# Patient Record
Sex: Male | Born: 1995 | Race: Black or African American | Hispanic: No | Marital: Single | Smoking: Never smoker
Health system: Southern US, Community
[De-identification: ages and names within clinical notes are randomized; demographics above are authoritative.]

## PROBLEM LIST (undated history)

## (undated) DIAGNOSIS — B20 Human immunodeficiency virus [HIV] disease: Secondary | ICD-10-CM

---

## 2018-03-11 DIAGNOSIS — R4182 Altered mental status, unspecified: Secondary | ICD-10-CM | POA: Insufficient documentation

## 2018-03-14 DIAGNOSIS — R918 Other nonspecific abnormal finding of lung field: Secondary | ICD-10-CM | POA: Insufficient documentation

## 2018-04-04 DIAGNOSIS — R918 Other nonspecific abnormal finding of lung field: Secondary | ICD-10-CM | POA: Insufficient documentation

## 2018-04-14 DIAGNOSIS — D649 Anemia, unspecified: Secondary | ICD-10-CM | POA: Insufficient documentation

## 2018-04-14 DIAGNOSIS — B20 Human immunodeficiency virus [HIV] disease: Secondary | ICD-10-CM | POA: Insufficient documentation

## 2018-04-14 DIAGNOSIS — D893 Immune reconstitution syndrome: Secondary | ICD-10-CM | POA: Insufficient documentation

## 2020-01-23 ENCOUNTER — Ambulatory Visit: Payer: Self-pay | Attending: Family

## 2020-01-23 DIAGNOSIS — Z23 Encounter for immunization: Secondary | ICD-10-CM

## 2020-01-23 NOTE — Progress Notes (Signed)
   Covid-19 Vaccination Clinic  Name:  Deray Dawes    MRN: [id number] DOB: [birth year]  01/23/2020  Mr. Cappella was observed post Covid-19 immunization for 15 minutes without incident. He was provided with Vaccine Information Sheet and instruction to access the V-Safe system.   Mr. Granja was instructed to call 911 with any severe reactions post vaccine: Marland Kitchen Difficulty breathing  . Swelling of face and throat  . A fast heartbeat  . A bad rash all over body  . Dizziness and weakness   Immunizations Administered    Name Date Dose VIS Date Route   Moderna COVID-19 Vaccine 01/23/2020 10:18 AM 0.5 mL 10/01/2019 Intramuscular   Manufacturer: Gala Murdoch   Lot: [id number]   NDC: 82883-374-45

## 2020-02-05 ENCOUNTER — Ambulatory Visit: Payer: Medicaid Other | Attending: Internal Medicine

## 2020-02-05 DIAGNOSIS — Z20822 Contact with and (suspected) exposure to covid-19: Secondary | ICD-10-CM

## 2020-02-06 LAB — SARS-COV-2, NAA 2 DAY TAT

## 2020-02-06 LAB — NOVEL CORONAVIRUS, NAA: SARS-CoV-2, NAA: NOT DETECTED

## 2020-02-25 ENCOUNTER — Ambulatory Visit: Payer: Medicaid Other | Attending: Family

## 2020-02-25 DIAGNOSIS — Z23 Encounter for immunization: Secondary | ICD-10-CM

## 2020-02-25 NOTE — Progress Notes (Signed)
   Covid-19 Vaccination Clinic  Name:  Paul Serrano    MRN: [id number] DOB: 06/13/1996  02/25/2020  [name] was observed post Covid-19 immunization for 15 minutes without incident. He was provided with Vaccine Information Sheet and instruction to access the V-Safe system.   [name] was instructed to call 911 with any severe reactions post vaccine: Marland Kitchen Difficulty breathing  . Swelling of face and throat  . A fast heartbeat  . A bad rash all over body  . Dizziness and weakness   Immunizations Administered    Name Date Dose VIS Date Route   Moderna COVID-19 Vaccine 02/25/2020 10:36 AM 0.5 mL 10/2019 Intramuscular   Manufacturer: Moderna   Lot: [id number]   NDC: 35329-924-26

## 2020-06-04 ENCOUNTER — Ambulatory Visit: Payer: Medicaid Other | Admitting: Family Medicine

## 2020-06-09 ENCOUNTER — Emergency Department (HOSPITAL_COMMUNITY): Payer: 59

## 2020-06-09 ENCOUNTER — Other Ambulatory Visit: Payer: Self-pay

## 2020-06-09 ENCOUNTER — Emergency Department (HOSPITAL_COMMUNITY)
Admission: EM | Admit: 2020-06-09 | Discharge: 2020-06-09 | Disposition: A | Discharge status: Home / Self Care | Payer: 59 | Attending: Emergency Medicine | Admitting: Emergency Medicine

## 2020-06-09 ENCOUNTER — Encounter (HOSPITAL_COMMUNITY): Payer: Self-pay | Admitting: Emergency Medicine

## 2020-06-09 DIAGNOSIS — B20 Human immunodeficiency virus [HIV] disease: Secondary | ICD-10-CM | POA: Diagnosis not present

## 2020-06-09 DIAGNOSIS — R519 Headache, unspecified: Secondary | ICD-10-CM | POA: Insufficient documentation

## 2020-06-09 HISTORY — DX: Human immunodeficiency virus (HIV) disease: B20

## 2020-06-09 MED ORDER — METOCLOPRAMIDE HCL 5 MG/ML IJ SOLN
10.0000 mg | Freq: Once | INTRAMUSCULAR | Status: AC
  Administered 2020-06-09: 10 mg via INTRAMUSCULAR
  Filled 2020-06-09: qty 2

## 2020-06-09 NOTE — ED Notes (Signed)
Patient received crackers and soda at this time.

## 2020-06-09 NOTE — ED Provider Notes (Signed)
[name] Provider Note   CSN: [id number] Arrival date & time: 06/09/20  7829     History Chief Complaint  Patient presents with  . Headache    Paul Serrano is a 24 y.o. male.  23 year old male who presents with headaches which was bitemporal lasting for several days.  Patient states his symptoms wax and wane and have not been associated fever or chills.  No neck pain.  No photophobia.  Denies any emesis.  Denies any weakness to his arms or legs.  Has been using ibuprofen with temporary relief.  No prior history of same.        Past Medical History:  Diagnosis Date  . HIV (human immunodeficiency virus infection) (HCC)     There are no problems to display for this patient.   History reviewed. No pertinent surgical history.     No family history on file.  Social History   Tobacco Use  . Smoking status: Never Smoker  . Smokeless tobacco: Never Used  Substance Use Topics  . Alcohol use: Never  . Drug use: Never    Home Medications Prior to Admission medications   Medication Sig Start Date End Date Taking? Authorizing Provider  darunavir-cobicistat (PREZCOBIX) 800-150 MG tablet Take 1 tablet by mouth daily with breakfast. Swallow whole. Do NOT crush, break or chew tablets. Take with food.   Yes [provider]  emtricitabine-tenofovir AF (DESCOVY) 200-25 MG tablet Take 1 tablet by mouth daily.   Yes [provider]  Etravirine 200 MG TABS Take 200 mg by mouth in the morning and at bedtime.   Yes [provider]  ibuprofen (ADVIL) 200 MG tablet Take 400 mg by mouth every 6 (six) hours as needed for headache or mild pain.   Yes [provider]    Allergies    Patient has no known allergies.  Review of Systems   Review of Systems  All other systems reviewed and are negative.   Physical Exam Updated Vital Signs BP 110/66 (BP Location: Left Arm)   Pulse (!) 101   Temp 99.4 F (37.4 C)  (Oral)   Resp (!) 27   Ht 1.829 m (6')   Wt 51.6 kg   SpO2 97%   BMI 15.43 kg/m   Physical Exam Vitals and nursing note reviewed.  Constitutional:      General: He is not in acute distress.    Appearance: Normal appearance. He is well-developed. He is not toxic-appearing.  HENT:     Head: Normocephalic and atraumatic.  Eyes:     General: Lids are normal.     Conjunctiva/sclera: Conjunctivae normal.     Pupils: Pupils are equal, round, and reactive to light.  Neck:     Thyroid: No thyroid mass.     Trachea: No tracheal deviation.  Cardiovascular:     Rate and Rhythm: Normal rate and regular rhythm.     Heart sounds: Normal heart sounds. No murmur heard.  No gallop.   Pulmonary:     Effort: Pulmonary effort is normal. No respiratory distress.     Breath sounds: Normal breath sounds. No stridor. No decreased breath sounds, wheezing, rhonchi or rales.  Abdominal:     General: Bowel sounds are normal. There is no distension.     Palpations: Abdomen is soft.     Tenderness: There is no abdominal tenderness. There is no rebound.  Musculoskeletal:        General: No tenderness. Normal range  of motion.     Cervical back: Normal range of motion and neck supple.  Skin:    General: Skin is warm and dry.     Findings: No abrasion or rash.  Neurological:     General: No focal deficit present.     Mental Status: He is alert and oriented to person, place, and time.     GCS: GCS eye subscore is 4. GCS verbal subscore is 5. GCS motor subscore is 6.     Cranial Nerves: No cranial nerve deficit.     Sensory: No sensory deficit.     Motor: Motor function is intact.  Psychiatric:        Speech: Speech normal.        Behavior: Behavior normal.     ED Results / Procedures / Treatments   Labs (all labs ordered are listed, but only abnormal results are displayed) Labs Reviewed - No data to display  EKG EKG Interpretation  Date/Time:  Tuesday June 09 2020 04:25:04 EDT Ventricular  Rate:  141 PR Interval:  130 QRS Duration: 76 QT Interval:  292 QTC Calculation: 447 R Axis:   49 Text Interpretation: Sinus tachycardia Marked ST abnormality, possible inferior subendocardial injury Abnormal ECG Confirmed by Lorre Nick ([id number]) on 06/09/2020 9:07:36 AM   Radiology No results found.  Procedures Procedures (including critical care time)  Medications Ordered in ED Medications  metoCLOPramide (REGLAN) injection 10 mg (has no administration in time range)    ED Course  I have reviewed the triage vital signs and the nursing notes.  Pertinent labs & imaging results that were available during my care of the patient were reviewed by me and considered in my medical decision making (see chart for details).    MDM Rules/Calculators/A&P                          Patient treated with Reglan and feels better.  Head CT without acute findings.  He is neurologically stable.  Will discharge.  Suspect likely tension headache.  No suspicion for meningitis Final Clinical Impression(s) / ED Diagnoses Final diagnoses:  None    Rx / DC Orders ED Discharge Orders    None       Lorre Nick, MD 06/09/20 1042

## 2020-06-09 NOTE — ED Triage Notes (Signed)
Pt reports having headaches for the last several days that has been worsening without any relief from Ibuprofen. Pt reports pain at temples bilaterally.

## 2020-06-10 ENCOUNTER — Ambulatory Visit: Payer: Medicaid Other | Admitting: Family Medicine

## 2020-07-14 DIAGNOSIS — F209 Schizophrenia, unspecified: Secondary | ICD-10-CM | POA: Insufficient documentation

## 2020-07-15 ENCOUNTER — Ambulatory Visit: Payer: 59

## 2020-07-15 ENCOUNTER — Other Ambulatory Visit: Payer: 59

## 2020-08-04 ENCOUNTER — Encounter: Payer: 59 | Admitting: Internal Medicine

## 2020-08-04 ENCOUNTER — Ambulatory Visit: Payer: 59 | Admitting: Pharmacist

## 2020-08-04 DIAGNOSIS — Z0000 Encounter for general adult medical examination without abnormal findings: Secondary | ICD-10-CM | POA: Insufficient documentation

## 2020-08-04 DIAGNOSIS — A071 Giardiasis [lambliasis]: Secondary | ICD-10-CM | POA: Insufficient documentation

## 2020-08-04 DIAGNOSIS — A09 Infectious gastroenteritis and colitis, unspecified: Secondary | ICD-10-CM | POA: Insufficient documentation

## 2020-08-04 DIAGNOSIS — B20 Human immunodeficiency virus [HIV] disease: Secondary | ICD-10-CM | POA: Insufficient documentation

## 2020-08-04 DIAGNOSIS — D509 Iron deficiency anemia, unspecified: Secondary | ICD-10-CM | POA: Insufficient documentation

## 2020-08-04 DIAGNOSIS — A310 Pulmonary mycobacterial infection: Secondary | ICD-10-CM | POA: Insufficient documentation

## 2020-08-04 NOTE — Assessment & Plan Note (Deleted)
Assessment: New patient here with HIV.  Discussed with patient treatment options and side effects, benefits of treatment, long term outcomes.  I discussed the severity of untreated HIV including higher cancer risk, opportunistic infections, renal failure.  Also discussed needing to use condoms, partner disclosure, necessary vaccines, blood monitoring.  All questions answered.    Plan: -- **

## 2020-08-04 NOTE — Progress Notes (Deleted)
[name] for Infectious Disease  Reason for Consult: HIV new patient Referring Provider: ***  HPI:  Paul Serrano is a 24 y.o. male with a past medical history as below who presents to clinic as a new patient for HIV care.  He is transferring his care from [place] in [place], [place].  HIV diagnosed: CD4 at diagnosis: VL at diagnosis: Genotype: [id number]: Recent CD4: Recent viral load: Prior ART: Current ART: Hx of OI: Hx of STI:  Single Tablet Regimen {CHL AMB HIV MEDICATION SINGLE TABLET REGIMENS:210130301}  Integrase Inhibitors {CHL AMB HIV MEDICATION INTEGRASE INHIBITORS:210130302}  Protease Inhibitors {CHL AMB HIV MEDICATIONS PROTEASE INHIBITORS:210130303}  PK Enhancer {CHL AMB HIV MEDICATIONS PK ENHANCER:210130304}  Nucleoside/Nucleotide Reverse Transcriptase Inhibitors (Nukes) {CHL AMB HIV MEDICATIONS NUCLEOSIDE/NUCLEOTIDE REVERSE TRANSCRIPTASE INHIBITORS (NUKES):210130305}  Non-Nucleoside Reverse Transcriptase Inhibitors (Non-Nukes) {CHL AMB HIV MEDICATIONS NON-NUCLEOSIDE REVERSE TRANSCRIPTASE INHIBITORS (NON-NUKES):210130306}  Legacy Drugs {CHL AMB HIV MEDICATIONS LEGACY DRUGS:210130307}  Have you ever been diagnosed with any opportunistic infections? {CHL AMB HIV OPPORTUNISTIC INFECTIONS:210130300}  Patient's Medications  New Prescriptions   No medications on file  Previous Medications   DARUNAVIR-COBICISTAT (PREZCOBIX) 800-150 MG TABLET    Take 1 tablet by mouth daily with breakfast. Swallow whole. Do NOT crush, break or chew tablets. Take with food.   EMTRICITABINE-TENOFOVIR AF (DESCOVY) 200-25 MG TABLET    Take 1 tablet by mouth daily.   ETRAVIRINE 200 MG TABS    Take 200 mg by mouth in the morning and at bedtime.   IBUPROFEN (ADVIL) 200 MG TABLET    Take 400 mg by mouth every 6 (six) hours as needed for headache or mild pain.  Modified Medications   No medications on file  Discontinued Medications   No  medications on file      Past Medical History:  Diagnosis Date  . HIV (human immunodeficiency virus infection) ([place])     Social History   Tobacco Use  . Smoking status: Never Smoker  . Smokeless tobacco: Never Used  Substance Use Topics  . Alcohol use: Never  . Drug use: Never    No family history on file.  No Known Allergies  Review of Systems: ROS    Objective: There were no vitals filed for this visit.   There is no height or weight on file to calculate BMI.   Physical Exam  Pertinent Labs and Microbiology:  No results found for: WBC, HGB, HCT, MCV, PLT  No results found for: CREATININE, BUN, NA, K, CL, CO2  No results found for: ALT, AST, GGT, ALKPHOS, BILITOT  No results found for: CHOL, HDL, LDLCALC, LDLDIRECT, TRIG, CHOLHDL No results found for: HIV1RNAQUANT, HIV1RNAVL, CD4TABS No results found for: HIV1GENOSEQ No results found for: HAV No results found for: HEPBSAG, HEPBSAB No results found for: HCVAB No results found for: CHLAMYDIAWP, N No results found for: GCPROBEAPT No results found for: QUANTGOLD   No results found for this or any previous visit (from the past 240 hour(s)).  Imaging: ***  All pertinent labs/notes reviewed.  All pertinent images have been personally visualized and interpreted; radiology reports have been reviewed.   Decision making incorporated into the Assessment and Plan.  Assessment and Plan: HIV disease ([place]) Assessment: New patient here with HIV.  Discussed with patient treatment options and side effects, benefits of treatment, long term outcomes.  I discussed the severity of untreated HIV including higher cancer risk, opportunistic infections, renal failure.  Also discussed needing to use condoms,  partner disclosure, necessary vaccines, blood monitoring.  All questions answered.    Plan: -- **   Other issues: 1. HIV disease ([place]) ***  2. Mycobacterium avium infection (HCC) ***  3. Giardia ***  4.  Diarrhea, infectious, adult ***  5. Iron deficiency anemia, unspecified iron deficiency anemia type ***   No orders of the defined types were placed in this encounter.       Raynelle Highland for Infectious Disease [facility] 08/04/2020, 8:58 AM

## 2020-08-18 ENCOUNTER — Telehealth: Payer: Self-pay | Admitting: Physician Assistant

## 2020-08-18 ENCOUNTER — Encounter: Payer: Self-pay | Admitting: Internal Medicine

## 2020-08-18 ENCOUNTER — Other Ambulatory Visit: Payer: Self-pay

## 2020-08-18 ENCOUNTER — Other Ambulatory Visit: Payer: 59

## 2020-08-18 ENCOUNTER — Other Ambulatory Visit: Payer: Self-pay | Admitting: *Deleted

## 2020-08-18 ENCOUNTER — Ambulatory Visit: Payer: 59

## 2020-08-18 DIAGNOSIS — Z113 Encounter for screening for infections with a predominantly sexual mode of transmission: Secondary | ICD-10-CM

## 2020-08-18 DIAGNOSIS — B20 Human immunodeficiency virus [HIV] disease: Secondary | ICD-10-CM

## 2020-08-18 DIAGNOSIS — Z79899 Other long term (current) drug therapy: Secondary | ICD-10-CM

## 2020-08-18 NOTE — Telephone Encounter (Signed)
Scheduled appointment per 10/19 new patient referral. Spoke to patient who is aware of appointment date and time.  

## 2020-08-19 LAB — T-HELPER CELL (CD4) - (RCID CLINIC ONLY)
CD4 % Helper T Cell: 5 % — ABNORMAL LOW (ref 33–65)
CD4 T Cell Abs: 55 /uL — ABNORMAL LOW (ref 400–1790)

## 2020-08-28 ENCOUNTER — Telehealth: Payer: Self-pay

## 2020-08-28 NOTE — Telephone Encounter (Signed)
[facility] Patient Product/process development scientist completed.   Patient have Forensic scientist through [place].  The patient is uninsured and will need patient assistance for medication.  We can complete the application and will need to meet with the patient for signatures and income documentation.  [name], CPhT Specialty Pharmacy Patient [facility] for Infectious Disease Phone: [phone] Fax:  [phone]

## 2020-09-01 ENCOUNTER — Encounter: Payer: Self-pay | Admitting: Internal Medicine

## 2020-09-01 ENCOUNTER — Other Ambulatory Visit: Payer: Self-pay

## 2020-09-01 ENCOUNTER — Ambulatory Visit (INDEPENDENT_AMBULATORY_CARE_PROVIDER_SITE_OTHER): Payer: 59 | Admitting: Pharmacist

## 2020-09-01 ENCOUNTER — Ambulatory Visit
Admission: RE | Admit: 2020-09-01 | Discharge: 2020-09-01 | Disposition: A | Discharge status: Home / Self Care | Payer: 59 | Source: Ambulatory Visit | Attending: Internal Medicine | Admitting: Internal Medicine

## 2020-09-01 ENCOUNTER — Ambulatory Visit (INDEPENDENT_AMBULATORY_CARE_PROVIDER_SITE_OTHER): Payer: 59 | Admitting: Internal Medicine

## 2020-09-01 VITALS — BP 106/74 | HR 111 | Temp 98.2°F | Wt 142.6 lb

## 2020-09-01 DIAGNOSIS — A539 Syphilis, unspecified: Secondary | ICD-10-CM

## 2020-09-01 DIAGNOSIS — R059 Cough, unspecified: Secondary | ICD-10-CM

## 2020-09-01 DIAGNOSIS — B20 Human immunodeficiency virus [HIV] disease: Secondary | ICD-10-CM

## 2020-09-01 LAB — HEPATITIS B CORE ANTIBODY, TOTAL: Hep B Core Total Ab: NONREACTIVE

## 2020-09-01 LAB — COMPLETE METABOLIC PANEL WITH GFR
AG Ratio: 0.4 (calc) — ABNORMAL LOW (ref 1.0–2.5)
ALT: 8 U/L — ABNORMAL LOW (ref 9–46)
AST: 26 U/L (ref 10–40)
Albumin: 2.7 g/dL — ABNORMAL LOW (ref 3.6–5.1)
Alkaline phosphatase (APISO): 175 U/L — ABNORMAL HIGH (ref 36–130)
BUN/Creatinine Ratio: 8 (calc) (ref 6–22)
BUN: 5 mg/dL — ABNORMAL LOW (ref 7–25)
CO2: 32 mmol/L (ref 20–32)
Calcium: 8.4 mg/dL — ABNORMAL LOW (ref 8.6–10.3)
Chloride: 103 mmol/L (ref 98–110)
Creat: 0.65 mg/dL (ref 0.60–1.35)
GFR, Est African American: 160 mL/min/{1.73_m2} (ref >=60)
GFR, Est Non African American: 138 mL/min/{1.73_m2} (ref >=60)
Globulin: 6.4 g/dL (calc) — ABNORMAL HIGH (ref 1.9–3.7)
Glucose, Bld: 106 mg/dL — ABNORMAL HIGH (ref 65–99)
Potassium: 3 mmol/L — ABNORMAL LOW (ref 3.5–5.3)
Sodium: 140 mmol/L (ref 135–146)
Total Bilirubin: 0.3 mg/dL (ref 0.2–1.2)
Total Protein: 9.1 g/dL — ABNORMAL HIGH (ref 6.1–8.1)

## 2020-09-01 LAB — LIPID PANEL
Cholesterol: 65 mg/dL (ref <200)
HDL: 24 mg/dL — ABNORMAL LOW (ref >=40)
LDL Cholesterol (Calc): 25 mg/dL (calc)
Non-HDL Cholesterol (Calc): 41 mg/dL (calc) (ref <130)
Total CHOL/HDL Ratio: 2.7 (calc) (ref <5.0)
Triglycerides: 81 mg/dL (ref <150)

## 2020-09-01 LAB — QUANTIFERON-TB GOLD PLUS
Mitogen-NIL: 0.43 IU/mL
NIL: 0.03 IU/mL
QuantiFERON-TB Gold Plus: UNDETERMINED — AB
TB1-NIL: 0 IU/mL
TB2-NIL: 0.01 IU/mL

## 2020-09-01 LAB — HIV-1 GENOTYPE: HIV-1 Genotype: DETECTED — AB

## 2020-09-01 LAB — CBC WITH DIFFERENTIAL/PLATELET
Absolute Monocytes: 418 cells/uL (ref 200–950)
Basophils Absolute: 19 cells/uL (ref 0–200)
Basophils Relative: 0.4 %
Eosinophils Absolute: 9 cells/uL — ABNORMAL LOW (ref 15–500)
Eosinophils Relative: 0.2 %
HCT: 26.3 % — ABNORMAL LOW (ref 38.5–50.0)
Hemoglobin: 8.3 g/dL — ABNORMAL LOW (ref 13.2–17.1)
Lymphs Abs: 1532 cells/uL (ref 850–3900)
MCH: 25.4 pg — ABNORMAL LOW (ref 27.0–33.0)
MCHC: 31.6 g/dL — ABNORMAL LOW (ref 32.0–36.0)
MCV: 80.4 fL (ref 80.0–100.0)
MPV: 11.3 fL (ref 7.5–12.5)
Monocytes Relative: 8.9 %
Neutro Abs: 2721 cells/uL (ref 1500–7800)
Neutrophils Relative %: 57.9 %
Platelets: 263 10*3/uL (ref 140–400)
RBC: 3.27 10*6/uL — ABNORMAL LOW (ref 4.20–5.80)
RDW: 15.9 % — ABNORMAL HIGH (ref 11.0–15.0)
Total Lymphocyte: 32.6 %
WBC: 4.7 10*3/uL (ref 3.8–10.8)

## 2020-09-01 LAB — HEPATITIS C ANTIBODY
Hepatitis C Ab: NONREACTIVE
SIGNAL TO CUT-OFF: 0.34 (ref <1.00)

## 2020-09-01 LAB — RPR TITER: RPR Titer: 1:1 {titer} — ABNORMAL HIGH

## 2020-09-01 LAB — HEPATITIS B SURFACE ANTIBODY,QUALITATIVE: Hep B S Ab: NONREACTIVE

## 2020-09-01 LAB — FLUORESCENT TREPONEMAL AB(FTA)-IGG-BLD: Fluorescent Treponemal ABS: REACTIVE — AB

## 2020-09-01 LAB — HIV-1/2 AB - DIFFERENTIATION
HIV-1 antibody: POSITIVE — AB
HIV-2 Ab: NEGATIVE

## 2020-09-01 LAB — HIV-1 RNA ULTRAQUANT REFLEX TO GENTYP+
HIV 1 RNA Quant: 5800000 copies/mL — ABNORMAL HIGH
HIV-1 RNA Quant, Log: 6.76 Log copies/mL — ABNORMAL HIGH

## 2020-09-01 LAB — HEPATITIS A ANTIBODY, TOTAL: Hepatitis A AB,Total: REACTIVE — AB

## 2020-09-01 LAB — HIV ANTIBODY (ROUTINE TESTING W REFLEX): HIV 1&2 Ab, 4th Generation: REACTIVE — AB

## 2020-09-01 LAB — HLA B*5701: HLA-B*5701 w/rflx HLA-B High: NEGATIVE

## 2020-09-01 LAB — RPR: RPR Ser Ql: REACTIVE — AB

## 2020-09-01 LAB — HEPATITIS B SURFACE ANTIGEN: Hepatitis B Surface Ag: NONREACTIVE

## 2020-09-01 MED ORDER — FLUCONAZOLE 100 MG PO TABS
400.0000 mg | ORAL_TABLET | Freq: Every day | ORAL | 0 refills | Status: DC
Start: 2020-09-01 — End: 2020-09-04

## 2020-09-01 MED ORDER — RITONAVIR 100 MG PO TABS: 100.0000 mg | ORAL_TABLET | Freq: Two times a day (BID) | ORAL | 1 refills | Status: DC

## 2020-09-01 MED ORDER — DARUNAVIR ETHANOLATE 600 MG PO TABS: 600.0000 mg | ORAL_TABLET | Freq: Two times a day (BID) | ORAL | 1 refills | Status: DC

## 2020-09-01 MED ORDER — TIVICAY 50 MG PO TABS: 50.0000 mg | ORAL_TABLET | Freq: Every day | ORAL | 1 refills | Status: DC

## 2020-09-01 MED ORDER — PENICILLIN G BENZATHINE 1200000 UNIT/2ML IM SUSP
1.2000 10*6.[IU] | Freq: Once | INTRAMUSCULAR | Status: AC
  Administered 2020-09-01: 1.2 10*6.[IU] via INTRAMUSCULAR

## 2020-09-01 MED ORDER — RITONAVIR 100 MG PO TABS: 100.0000 mg | ORAL_TABLET | Freq: Every day | ORAL | 1 refills | Status: DC

## 2020-09-01 NOTE — Patient Instructions (Addendum)
Thank you for seeing Paul Serrano   I am concerned that your viral load is so high even while you have been taking your medication  Will do genetic testing to test for drug resistance  Given your low cd4 count, and cough. Will check chest xray, sputum culture  We will change your hiv to darunavir and ritonavir and dolutegravir.  You also have thrush. So I will prescribe 2 weeks of fluconazole  If you have any fever, chill, headache, visual change or any concerning change in your health, please call our clinic  We'll also setup covid booster for you  Today we will start syphilis antibiotics shot (3 shots series, 1 week apart). You can come back to nurse visit for the other 2 shots   We'll see you in 2 weeks

## 2020-09-01 NOTE — Progress Notes (Signed)
HPI: Paul Serrano is a 24 y.o. male who presents to the [facility] pharmacy clinic for transferring HIV care.   Patient Active Problem List   Diagnosis Date Noted  . HIV disease (HCC) 08/04/2020  . Giardia 08/04/2020  . Diarrhea, infectious, adult 08/04/2020  . Iron deficiency anemia 08/04/2020  . Healthcare maintenance 08/04/2020  . Mycobacterium avium infection (HCC)     Patient's Medications  New Prescriptions   No medications on file  Previous Medications   DARUNAVIR-COBICISTAT (PREZCOBIX) 800-150 MG TABLET    Take 1 tablet by mouth daily with breakfast. Swallow whole. Do NOT crush, break or chew tablets. Take with food.   EMTRICITABINE-TENOFOVIR AF (DESCOVY) 200-25 MG TABLET    Take 1 tablet by mouth daily.   ETRAVIRINE 200 MG TABS    Take 200 mg by mouth in the morning and at bedtime.   IBUPROFEN (ADVIL) 200 MG TABLET    Take 400 mg by mouth every 6 (six) hours as needed for headache or mild pain.  Modified Medications   No medications on file  Discontinued Medications   No medications on file    Allergies: No Known Allergies  Past Medical History: Past Medical History:  Diagnosis Date  . HIV (human immunodeficiency virus infection) (HCC)     Social History: Social History   Socioeconomic History  . Marital status: Single    Spouse name: Not on file  . Number of children: Not on file  . Years of education: Not on file  . Highest education level: Not on file  Occupational History  . Not on file  Tobacco Use  . Smoking status: Never Smoker  . Smokeless tobacco: Never Used  Substance and Sexual Activity  . Alcohol use: Never  . Drug use: Never  . Sexual activity: Not on file  Other Topics Concern  . Not on file  Social History Narrative  . Not on file   Social Determinants of Health   Financial Resource Strain:   . Difficulty of Paying Living Expenses: Not on file  Food Insecurity:   . Worried About Programme researcher, broadcasting/film/video in the Last Year: Not on file  .  Ran Out of Food in the Last Year: Not on file  Transportation Needs:   . Lack of Transportation (Medical): Not on file  . Lack of Transportation (Non-Medical): Not on file  Physical Activity:   . Days of Exercise per Week: Not on file  . Minutes of Exercise per Session: Not on file  Stress:   . Feeling of Stress : Not on file  Social Connections:   . Frequency of Communication with Friends and Family: Not on file  . Frequency of Social Gatherings with Friends and Family: Not on file  . Attends Religious Services: Not on file  . Active Member of Clubs or Organizations: Not on file  . Attends Banker Meetings: Not on file  . Marital Status: Not on file    Labs: Lab Results  Component Value Date   HIV1RNAQUANT 5,800,000 (H) 08/18/2020   CD4TABS 55 (L) 08/18/2020    RPR and STI Lab Results  Component Value Date   LABRPR REACTIVE (A) 08/18/2020   RPRTITER 1:1 (H) 08/18/2020    No flowsheet data found.  Hepatitis B Lab Results  Component Value Date   HEPBSAB NON-REACTIVE 08/18/2020   HEPBSAG NON-REACTIVE 08/18/2020   HEPBCAB NON-REACTIVE 08/18/2020   Hepatitis C Lab Results  Component Value Date   HEPCAB NON-REACTIVE 08/18/2020  Hepatitis A Lab Results  Component Value Date   HAV REACTIVE (A) 08/18/2020   Lipids: Lab Results  Component Value Date   CHOL 65 08/18/2020   TRIG 81 08/18/2020   HDL 24 (L) 08/18/2020   CHOLHDL 2.7 08/18/2020   LDLCALC 25 08/18/2020    Current HIV Regimen: Prezcobix 1 tablet once daily + Etravirine 1 tablet twice daily + Descovy 1 tablet daily  Assessment: [name] presents today to clinic today to transfer HIV care with Dr. Renold Don. He states he has been adherent with the regimen listed above. Of note, darunavir may decrease the concentrations of etravirine. Further genotypic testing has been ordered; however, Dr. Renold Don would like to change his regimen to Tivicay 1 tablet daily and Prezista 600mg  BID/ritonvir 100mg  BID today  given his VL (5.8 million) and low CD4 (55) pending resistance information. Counseled patient to take Prezista with food and to separate Tivicay from his daily iron supplement. Informed him to call clinic if he starts any medications or has any issues with new medicines.  Plan: Counseled patient on Prezista/ritonavir and Tivicay Sent prescriptions to [facility] , PharmD PGY2 ID Pharmacy Resident [phone]  09/01/2020, 11:30 AM

## 2020-09-01 NOTE — Addendum Note (Signed)
Addended by: Harley Alto on: 09/01/2020 04:45 PM   Modules accepted: Orders

## 2020-09-01 NOTE — Progress Notes (Signed)
[facility] for Infectious Disease  Reason for Consult:HIV care     Patient Active Problem List   Diagnosis Date Noted  . HIV disease ([place]) 08/04/2020  . Giardia 08/04/2020  . Diarrhea, infectious, adult 08/04/2020  . Iron deficiency anemia 08/04/2020  . Healthcare maintenance 08/04/2020  . Mycobacterium avium infection ([place])     Patient's Medications  New Prescriptions   No medications on file  Previous Medications   DARUNAVIR-COBICISTAT (PREZCOBIX) 800-150 MG TABLET    Take 1 tablet by mouth daily with breakfast. Swallow whole. Do NOT crush, break or chew tablets. Take with food.   EMTRICITABINE-TENOFOVIR AF (DESCOVY) 200-25 MG TABLET    Take 1 tablet by mouth daily.   ETRAVIRINE 200 MG TABS    Take 200 mg by mouth in the morning and at bedtime.   IBUPROFEN (ADVIL) 200 MG TABLET    Take 400 mg by mouth every 6 (six) hours as needed for headache or mild pain.  Modified Medications   No medications on file  Discontinued Medications   No medications on file    HPI: Paul Serrano is a 24 y.o. male hx paranoid schizophrenia, hiv/aids here to establish care  He was previously cared for at [facility]. He goes to school at [employer] so trying to get care for here  HIV/AIDS: dxed 2019 -- had mental health issue and had screening test so incidental finding.  Had some kind of pneumonia during 02/2018 hospitalized for 1 month -- recalled the name of bactrim Risk factors: no hx ivdu; had a tatoo; no blood transfusion; not many sexual partners in the past; bisexual. Recall cd4 nadir "really low." Therapy Prezcobix/descovy and etravirine -- vl undetectable... this regimen for a year Other therapies since 2.5 years prior to this visit.   No missed dose the past week.   I reviewed medical records sent: -hiv dx 01/2018; bixexual; disseminated MAC hx (by lymph node biopsy -- [facility]); cd4 nadir 30; initial viral load 985k -baseline labs; hep A ab positive;  hep B sAg, sAb, cAb negative; hep C ab negative; cmv igg positive; g6pd not deficient; [id number] negative; toxoplasma negative; quant gold negative -last annual screening rpr negative 2020 -HIV genotypes: Initial 01/2018 pansensitivie 04/2018 (on meds of 2 months) - Ftc, abc, 3tc, tivicay, biktegravir/elvitigravir resistant 07/26/2019 (on meds) NRTI - S abc, didanosine, ftc, ltc, stavudine, tdf, zdt; RAM's V118I,  NNRTI - S doravirine, sustiva, Etravirine, nevirapine; R rilpivirine; RAM's e138A, I178L INI - S bictegravir, elvitegravir, Raltegravir (no RAM's) PI - S ATV, drv, fosamprenavir, Indinavir, Lopinavir, Nelfinavir, Ritonavir, Saquinavir, Tipranavir -ART starts 03/15/18 -therapy 09/06/18-current prezcobix, descovy, etravirine; last note 07/2019 menions no missed dose 04/06/18-09/05/18 lamivudine/dolutebravir  -- virologic failure 03/15/18-04/06/18 biktarvy  -- ddi -hiv labs 07/26/2019 cd4 589 (14%), VL 3140  01/09/2019 cd4 140 and viral load 4.5 million (off meds for some time prior to that) 09/2018 cd4 370 (23%), VL 30k 03/2018 cd4 459, VL 1.06 million  Social: -born/raised in [place]; no prior travel out of state -have a dog "[name]" of 4 months old as of 08/2020 -hobbies singing; church choir; no outdoor activities; no hunting/fishing -lives alone in [name] -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 -no smoking, drinking, substance use including marijanna -2 sisters in [place] -no hx homelessness/incarceration -not in a relationship at this time -last time sexually active June 2021  Labs reviewed from 07/2020  Quant gold indeterminate Patient's test was  positive for specific/nonsepcific titer. No prior tx or known exposure. Doesn't recall any rash on penis/body   covid vaccines: 12/2019 finished moderna vaccine   Review of Systems: ROS New facial rash Coughing for a few days Denies headache, visual change, nightsweat, weight loss Appetite  intact No lump/bump on body No joint pain/muscle pain/weakness No sob No n/v/diarrhea/abd pain No penile dishcarge, dysuria No numbness/tingling No easy bruising/bleeding No HI/SI  Past Medical History:  Diagnosis Date  . HIV (human immunodeficiency virus infection) ([place])     Social History   Tobacco Use  . Smoking status: Never Smoker  . Smokeless tobacco: Never Used  Substance Use Topics  . Alcohol use: Never  . Drug use: Never    No family history on file. No Known Allergies  OBJECTIVE: Vitals:   09/01/20 1051  BP: 106/74  Pulse: (!) 111  Temp: 98.2 F (36.8 C)  TempSrc: Oral  Weight: 142 lb 9.6 oz (64.7 kg)   Body mass index is 19.34 kg/m.   Physical Exam General: alert/oriented; no distress Psych: doesn't seem to respond to internal stimulit; conversant; interactive; cooperative HEENT: normocephalic; per; ocnj clear; oropharynx thrush Skin: facial seborrheic dermatitis  cv rrr no mrg Lungs clear abd s/nt Ext no edema Neuro cn2-12 intact; strength/reflex intact; no tremor Back nontender Lymph no cervical/ingiunal/axillary lymphadenopathy  Lab: 08/18/2020 Cbc 5/8/263; cr 0.65; lft 26/8; tbili 0.3 RPR 1:1 fta positive Hep A ab reactive Hep B sAb, cAb, sAb nonreactive Hep C cAb nonreactive Hiv 5.8 million units cd4 55 (5%)  Microbiology: No results found for this or any previous visit (from the past 240 hour(s)).  Serology:  Imaging:   Assessment/plan: 24 yo male hx paranoid schizophrenia, aids, here to establish care   Patient Active Problem List   Diagnosis Date Noted  . HIV disease ([place]) 08/04/2020  . Giardia 08/04/2020  . Diarrhea, infectious, adult 08/04/2020  . Iron deficiency anemia 08/04/2020  . Healthcare maintenance 08/04/2020  . 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 Genotype on biktarvy showed resistance to all tivicay,  biktarvy, multiple nrti. Subsequently switched to dovato (perhaps no genotype data yet by then), and currently prezcobix/etravirine/descovy. Viral load in the millions 07/2018 and cd4 55. Last genotype 07/2019 sensitive to several nrti, nnrti, PI, and INI. Suspect he is not taking meds as the genotype show wild type virus. Query his mental health well being playing a role. -I have put him on bid prezista/rit and tivicay but suspect will need to switch and include rukobia/prezista-r/lamivudine; he doesn't have chronic hep b -discussed u=u -discussed compliant but might need to investigate mental health  #cough -low cd4 will keep broad ddx; not septic -sputum cx, fungitell, cxr -no prophy bactrim yet pending wu -f/u 1 week  #syphilis -latent infection presumption per history (no rash). 3 shots benzathine pcn weekly  #hx schizo -not on meds -will need to investigate more (see above for concern)  #hcm Will review vaccine Await improvement cd4 prior to starting hep b vaccine   I am having Gaspar Bidding maintain his Descovy, Etravirine, Prezcobix, and ibuprofen.   No orders of the defined types were placed in this encounter.    Follow-up: No follow-ups on file.  Jabier Mutton, [facility] for Infectious Disease [place] -- -- pager   [phone] cell 09/01/2020, 11:22 AM

## 2020-09-02 NOTE — Progress Notes (Signed)
[facility] [phone]   [phone]  CONSULT NOTE  REFERRING PHYSICIAN: Dr. Drue Stager  REASON FOR CONSULTATION:  Anemia  HPI Kwesi Sangha is a 24 y.o. male with a past medical history significant for HIV, microbacterium avium complex, syphilis, and Giardia is referred to the clinic for evaluation of anemia.  The patient was seen in the ER on June 09, 2020. He was reportedly given 1 unit of blood for anemia. He is unsure what his hemoglobin was this day.  The patient was referred to the clinic after being seen in the emergency room at [facility] on 07/14/2020.  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  He was subsequently sent to the emergency room for further evaluation and management.  While in the emergency room, the patient's hemoglobin was noted to be 6.0.  He was treated with 2 units of blood.  The patient had several lab studies performed including iron studies which showed a elevated ferritin at 760, low iron at 43, TIBC at 221, and a saturation ratio at 19%.  The patient's haptoglobin and LDH were within normal limits.  The B12 was within normal limits at 624.  The patient's folic acid was normal at 23.4.  The patient had stool cards performed which were negative for blood.  The patient was referred to the clinic for further evaluation and consideration of IV iron.   The patient states that he is been told that he is iron deficiency anemia for approximately 2.5 years.  Per chart review, it appears that the patient started having mild anemia with a hemoglobin around ~10 starting in December 2018.  He had more significant anemia within the last year with hemoglobins between 6 and 8.  The oldest records available to me are from 2017 in which his hemoglobin was around 15 at that time. The patient has been taking OTC iron supplements since August 2021 and has been compliant with this. He  tolerates them well without any adverse side effects. He does not know the miligram dosage of the iron. He also takes I33 and folic acid supplements as well.   The patient reports fatigue and some lightheadedness. He reports that sometimes his heart feels like it is beating hard as well. He denies dyspnea on exertion.  He denies any nausea, vomiting, diarrhea, or constipation.  He denies any fever, chills, or night sweats. He denies any abnormal bleeding or bruising including epistaxis, gingival bleeding, hemoptysis, hematemesis, melena, hematochezia, or hematuria.  The patient has never had a colonoscopy.  The patient is not on any blood thinners.  The patient denies any particular dietary habits such as being a vegan or vegetarian. He eats red meat about 4-5 times per week. The patient has never had gastric surgery.  The patient denies any history of celiac disease.  The patient denies NSAID use except he will occasionally take aleve but not routinely.  The patient denies any kidney disease.  The patient denies craving starch or ice.  The patient's family history consists of a mother who reportedly has anemia which started during her first pregnancy.  She reportedly takes iron supplements.  The patient's mother also has hypertension.  The patient has an older sister who also was anemic.  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  He is single and does not have any children.  He denies any drug,  alcohol, or tobacco use.  HPI  Past Medical History:  Diagnosis Date  . HIV (human immunodeficiency virus infection) ([place])     No past surgical history on file.  No family history on file.  Social History Social History   Tobacco Use  . Smoking status: Never Smoker  . Smokeless tobacco: Never Used  Substance Use Topics  . Alcohol use: Never  . Drug use: Never    No Known Allergies  Current Outpatient Medications  Medication Sig Dispense Refill  . darunavir  (PREZISTA) 600 MG tablet Take 1 tablet (600 mg total) by mouth 2 (two) times daily. 60 tablet 1  . dolutegravir (TIVICAY) 50 MG tablet Take 1 tablet (50 mg total) by mouth daily. 30 tablet 1  . fluconazole (DIFLUCAN) 100 MG tablet Take 4 tablets (400 mg total) by mouth daily for 14 days. 56 tablet 0  . ibuprofen (ADVIL) 200 MG tablet Take 400 mg by mouth every 6 (six) hours as needed for headache or mild pain.    . ritonavir (NORVIR) 100 MG TABS tablet Take 1 tablet (100 mg total) by mouth 2 (two) times daily. 60 tablet 1   No current facility-administered medications for this visit.   REVIEW OF SYSTEMS:   Review of Systems  Constitutional: Positive for fatigue.  Negative for appetite change, chills, fever and unexpected weight change.  HENT: Negative for mouth sores, nosebleeds, sore throat and trouble swallowing.   Eyes: Negative for eye problems and icterus.  Respiratory: Negative for cough, hemoptysis, shortness of breath and wheezing.   Cardiovascular: Negative for chest pain and leg swelling.  Gastrointestinal: Negative for abdominal pain, constipation, diarrhea, nausea and vomiting.  Genitourinary: Negative for bladder incontinence, difficulty urinating, dysuria, frequency and hematuria.   Musculoskeletal: Negative for back pain, gait problem, neck pain and neck stiffness.  Skin: Negative for itching and rash.  Neurological: Positive for occasional light headedness. Negative for dizziness, extremity weakness, gait problem, headaches, and seizures.  Hematological: Negative for adenopathy. Does not bruise/bleed easily.  Psychiatric/Behavioral: Negative for confusion, depression and sleep disturbance. The patient is not nervous/anxious.     PHYSICAL EXAMINATION:  Blood pressure 135/85, pulse 83, temperature (!) 97.4 F (36.3 C), temperature source Tympanic, resp. rate 18, height 6' (1.829 m), weight 143 lb 14.4 oz (65.3 kg), SpO2 100 %.  ECOG PERFORMANCE STATUS: 1  Physical Exam    Constitutional: Oriented to person, place, and time and well-developed, well-nourished, and in no distress. HENT:  Head: Normocephalic and atraumatic.  Mouth/Throat: Oropharynx is clear and moist. No oropharyngeal exudate.  Eyes: Conjunctivae are normal. Right eye exhibits no discharge. Left eye exhibits no discharge. No scleral icterus.  Neck: Normal range of motion. Neck supple.  Cardiovascular: Normal rate, regular rhythm, normal heart sounds and intact distal pulses.   Pulmonary/Chest: Effort normal and breath sounds normal. No respiratory distress. No wheezes. No rales.  Abdominal: Soft. Bowel sounds are normal. Exhibits no distension and no mass. There is no tenderness.  Musculoskeletal: Normal range of motion. Exhibits no edema.  Lymphadenopathy:    No cervical adenopathy.  Neurological: Alert and oriented to person, place, and time. Exhibits normal muscle tone. Gait normal. Coordination normal.  Skin: Skin is warm and dry. No rash noted. Not diaphoretic. No erythema. No pallor.  Psychiatric: Mood, memory and judgment normal.  Vitals reviewed.  LABORATORY DATA: Lab Results  Component Value Date   WBC 4.1 09/08/2020   HGB 8.1 (L) 09/08/2020   HCT 26.2 (L) 09/08/2020  MCV 82.1 09/08/2020   PLT 299 09/08/2020      Chemistry      Component Value Date/Time   NA 144 09/08/2020 1349   K 3.4 (L) 09/08/2020 1349   CL 108 09/08/2020 1349   CO2 30 09/08/2020 1349   BUN 5 (L) 09/08/2020 1349   CREATININE 0.91 09/08/2020 1349   CREATININE 0.65 08/18/2020 1012      Component Value Date/Time   CALCIUM 8.8 (L) 09/08/2020 1349   ALKPHOS 152 (H) 09/08/2020 1349   AST 18 09/08/2020 1349   ALT <6 09/08/2020 1349   BILITOT 0.3 09/08/2020 1349       RADIOGRAPHIC STUDIES: DG Chest 2 View  Result Date: 09/02/2020 CLINICAL DATA:  Cough EXAM: CHEST - 2 VIEW COMPARISON:  None. FINDINGS: The heart size and mediastinal contours are within normal limits. Both lungs are clear. The  visualized skeletal structures are unremarkable. IMPRESSION: Negative Electronically Signed   By: Rolm Baptise M.D.   On: 09/02/2020 23:13    ASSESSMENT: This is a very pleasant 24 year old African-American male referred to the clinic for evaluation of anemia.   PLAN: The patient was seen with Dr. Julien Nordmann today.  The patient had several lab studies performed including a CBC, CMP, LDH, reticulocyte count, iron studies, ferritin, SPEP with immunofixation, and hemoglobinopathy panel.  He also was given stool cards to assess for GI blood loss.  The patient CBC from today shows a low hemoglobin at 8.1.  The patient's CMP shows an elevated total protein at 9.8 but a low albumin. The patient has an SPEP pending to further assess this.  The patient's iron studies are within normal limits.  The patient's ferritin is elevated 843 likely secondary to his chronic diseases. The patient's reticulocyte count is normal.   The patient's SPEP, hemoglobinopathy panel, and stool cards are pending at this time.   We will wait for his pending lab studies before making further recommendations.  I will personally call the patient once I have the results of his lab studies.  In the meantime, the patient was instructed to continue taking his vitamin supplements.  He was instructed to take his iron supplement with vitamin C.  The patient voices understanding of current disease status and treatment options and is in agreement with the current care plan.  All questions were answered. The patient knows to call the clinic with any problems, questions or concerns. We can certainly see the patient much sooner if necessary.  Thank you so much for allowing me to participate in the care of [facility]. I will continue to follow up the patient with you and assist in his care.  The total time spent in the appointment was 60 minutes.  Disclaimer: This note was dictated with voice recognition software. Similar sounding words can  inadvertently be transcribed and may not be corrected upon review.   Tremar Wickens L Arlena Marsan September 08, 2020, 3:23 PM  ADDENDUM: Hematology/Oncology Attending: I had a face-to-face encounter with the patient today.  I recommended his care plan.  This is a very pleasant 24 years old African-American male with past medical history significant for HIV, MAC, syphilis, Giardia as well as history of anemia for few years.  His most recent admission to the emergency department at [facility] on 07/14/2020 showed hemoglobin of 6.0.  He received PRBCs transfusion at that time.  He has several studies performed at that time including iron study that showed normal low serum iron of 43, ferritin level was elevated  at 760 with an iron saturation of 19% he also has hemolytic work-up including haptoglobin and LDH that were within the normal range.  He had normal B12 and serum folate.  The patient was referred to [place] today for evaluation and recommendation regarding his condition and consideration of iron infusion if needed. When seen today he continues to have mild fatigue.  We order several studies on the patient today including repeat iron study and ferritin that were unremarkable for any iron deficiency and he has elevated ferritin level as an acute phase reactant.  He was also noted on his blood work to have elevated serum protein especially serum globulin. I order hemoglobin electrophoresis as well as serum protein electrophoresis. We will also order stool for Hemoccult. We will call the patient with the result and further recommendation based on the pending results. I do not see a need to consider the patient for iron infusion at this point and I think his anemia could be secondary to either chronic blood loss or anemia of chronic disease. He was advised to call immediately if he has any other concerning symptoms in the interval.  Disclaimer: This note was dictated with voice recognition software. Similar sounding  words can inadvertently be transcribed and may be missed upon review. Eilleen Kempf, MD 09/08/20

## 2020-09-04 ENCOUNTER — Telehealth: Payer: Self-pay

## 2020-09-04 ENCOUNTER — Other Ambulatory Visit: Payer: Self-pay | Admitting: *Deleted

## 2020-09-04 DIAGNOSIS — B370 Candidal stomatitis: Secondary | ICD-10-CM

## 2020-09-04 DIAGNOSIS — B20 Human immunodeficiency virus [HIV] disease: Secondary | ICD-10-CM

## 2020-09-04 MED ORDER — FLUCONAZOLE 100 MG PO TABS: 400.0000 mg | ORAL_TABLET | Freq: Every day | ORAL | 0 refills | Status: AC

## 2020-09-04 NOTE — Telephone Encounter (Signed)
[facility] Patient Advocate Encounter   Was successful in obtaining a Viiv copay card for [facility].  This copay card will make the patients copay 0.00.  I have spoken with the patient.       [name], CPhT Specialty Pharmacy Patient [facility] for Infectious Disease Phone: [phone] Fax:  [phone]

## 2020-09-04 NOTE — Telephone Encounter (Signed)
[facility] Patient Advocate Encounter   Was successful in obtaining a [employer] copay card for [facility].  This copay card will make the patients copay 0.00.  I have spoken with the patient.      [name], CPhT Specialty Pharmacy Patient [facility] for Infectious Disease Phone: [phone] Fax:  [phone]

## 2020-09-04 NOTE — Progress Notes (Signed)
Patient told to take 400 mg fluconazole for 14 days, but only 2.5 doses sent to pharmacy. RN resent prescription to [facility] for 2 week supply. Andree Coss, RN

## 2020-09-08 ENCOUNTER — Other Ambulatory Visit: Payer: Self-pay

## 2020-09-08 ENCOUNTER — Telehealth: Payer: Self-pay

## 2020-09-08 ENCOUNTER — Ambulatory Visit (INDEPENDENT_AMBULATORY_CARE_PROVIDER_SITE_OTHER): Payer: 59 | Admitting: *Deleted

## 2020-09-08 ENCOUNTER — Inpatient Hospital Stay: Payer: 59

## 2020-09-08 ENCOUNTER — Encounter: Payer: Self-pay | Admitting: Physician Assistant

## 2020-09-08 ENCOUNTER — Other Ambulatory Visit: Payer: Self-pay | Admitting: Physician Assistant

## 2020-09-08 ENCOUNTER — Inpatient Hospital Stay: Payer: 59 | Attending: Physician Assistant | Admitting: Physician Assistant

## 2020-09-08 VITALS — BP 135/85 | HR 83 | Temp 97.4°F | Resp 18 | Ht 72.0 in | Wt 143.9 lb

## 2020-09-08 DIAGNOSIS — D649 Anemia, unspecified: Secondary | ICD-10-CM | POA: Insufficient documentation

## 2020-09-08 DIAGNOSIS — D573 Sickle-cell trait: Secondary | ICD-10-CM | POA: Diagnosis not present

## 2020-09-08 DIAGNOSIS — A539 Syphilis, unspecified: Secondary | ICD-10-CM | POA: Diagnosis not present

## 2020-09-08 DIAGNOSIS — R5383 Other fatigue: Secondary | ICD-10-CM | POA: Insufficient documentation

## 2020-09-08 DIAGNOSIS — D500 Iron deficiency anemia secondary to blood loss (chronic): Secondary | ICD-10-CM

## 2020-09-08 DIAGNOSIS — B20 Human immunodeficiency virus [HIV] disease: Secondary | ICD-10-CM | POA: Insufficient documentation

## 2020-09-08 DIAGNOSIS — D638 Anemia in other chronic diseases classified elsewhere: Secondary | ICD-10-CM | POA: Diagnosis not present

## 2020-09-08 DIAGNOSIS — D509 Iron deficiency anemia, unspecified: Secondary | ICD-10-CM

## 2020-09-08 LAB — CBC WITH DIFFERENTIAL (CANCER CENTER ONLY)
Abs Immature Granulocytes: 0.02 10*3/uL (ref 0.00–0.07)
Basophils Absolute: 0 10*3/uL (ref 0.0–0.1)
Basophils Relative: 0 %
Eosinophils Absolute: 0 10*3/uL (ref 0.0–0.5)
Eosinophils Relative: 1 %
HCT: 26.2 % — ABNORMAL LOW (ref 39.0–52.0)
Hemoglobin: 8.1 g/dL — ABNORMAL LOW (ref 13.0–17.0)
Immature Granulocytes: 1 %
Lymphocytes Relative: 26 %
Lymphs Abs: 1.1 10*3/uL (ref 0.7–4.0)
MCH: 25.4 pg — ABNORMAL LOW (ref 26.0–34.0)
MCHC: 30.9 g/dL (ref 30.0–36.0)
MCV: 82.1 fL (ref 80.0–100.0)
Monocytes Absolute: 0.4 10*3/uL (ref 0.1–1.0)
Monocytes Relative: 9 %
Neutro Abs: 2.6 10*3/uL (ref 1.7–7.7)
Neutrophils Relative %: 63 %
Platelet Count: 299 10*3/uL (ref 150–400)
RBC: 3.19 MIL/uL — ABNORMAL LOW (ref 4.22–5.81)
RDW: 14.5 % (ref 11.5–15.5)
WBC Count: 4.1 10*3/uL (ref 4.0–10.5)
nRBC: 0 % (ref 0.0–0.2)

## 2020-09-08 LAB — RETIC PANEL
Immature Retic Fract: 12.5 % (ref 2.3–15.9)
RBC.: 3.16 MIL/uL — ABNORMAL LOW (ref 4.22–5.81)
Retic Count, Absolute: 25 10*3/uL (ref 19.0–186.0)
Retic Ct Pct: 0.8 % (ref 0.4–3.1)
Reticulocyte Hemoglobin: 28.7 pg (ref >27.9)

## 2020-09-08 LAB — CMP (CANCER CENTER ONLY)
ALT: <6 U/L (ref 0–44)
AST: 18 U/L (ref 15–41)
Albumin: 2.6 g/dL — ABNORMAL LOW (ref 3.5–5.0)
Alkaline Phosphatase: 152 U/L — ABNORMAL HIGH (ref 38–126)
Anion gap: 6 (ref 5–15)
BUN: 5 mg/dL — ABNORMAL LOW (ref 6–20)
CO2: 30 mmol/L (ref 22–32)
Calcium: 8.8 mg/dL — ABNORMAL LOW (ref 8.9–10.3)
Chloride: 108 mmol/L (ref 98–111)
Creatinine: 0.91 mg/dL (ref 0.61–1.24)
GFR, Estimated: >60 mL/min (ref >60)
Glucose, Bld: 96 mg/dL (ref 70–99)
Potassium: 3.4 mmol/L — ABNORMAL LOW (ref 3.5–5.1)
Sodium: 144 mmol/L (ref 135–145)
Total Bilirubin: 0.3 mg/dL (ref 0.3–1.2)
Total Protein: 9.8 g/dL — ABNORMAL HIGH (ref 6.5–8.1)

## 2020-09-08 LAB — IRON AND TIBC
Iron: 73 ug/dL (ref 42–163)
Saturation Ratios: 30 % (ref 20–55)
TIBC: 243 ug/dL (ref 202–409)
UIBC: 169 ug/dL (ref 117–376)

## 2020-09-08 LAB — SAMPLE TO BLOOD BANK

## 2020-09-08 LAB — FERRITIN: Ferritin: 843 ng/mL — ABNORMAL HIGH (ref 24–336)

## 2020-09-08 MED ORDER — PENICILLIN G BENZATHINE 1200000 UNIT/2ML IM SUSP
1.2000 10*6.[IU] | Freq: Once | INTRAMUSCULAR | Status: AC
  Administered 2020-09-08: 1.2 10*6.[IU] via INTRAMUSCULAR

## 2020-09-08 NOTE — Telephone Encounter (Signed)
Patient states he currently coughing up mucous. States this is a typical indicator of cold for him.  Did relay message from Pharmacy team. Will call office with additional questions/concerns.

## 2020-09-08 NOTE — Telephone Encounter (Signed)
Patient requested CMA forward message to provider regarding some concerns about his ART. Would like to know if he would be okay taking OTC cold medicine with ART. Would like to know if there is anything specific MD could recommend to ensure no interactions occur. Also what directions should patient follow if any special instructions apply to case. Will forward message to MD to advise on concern.

## 2020-09-08 NOTE — Telephone Encounter (Signed)
Pt scheduled for 30 min appointment on 11/11 at 1:45.

## 2020-09-08 NOTE — Telephone Encounter (Signed)
He can take Sudafed or OTC allergy medication like Claritin, Allegra, or Zytrec. He needs to stay away from [facility] and other intranasal steroids as those are contraindicated with his Prezista and Norvir. He can use saline nasal spray if needed. He also needs to be careful with anything containing dextromethorphan and use that sparingly. It may be easier if he tells you his symptoms or has specific product questions.

## 2020-09-08 NOTE — Progress Notes (Signed)
RN offered condoms, advised patient to remain abstinent for 7-10 days after treatment, and that the health department would be following up with him to confirm treatment. Patient's questions answered to their satisfaction. Patient met with Audelia Hives.  Cece to walk patient to front desk at completion of visit, as patient has a chronic abnormal gait (has had this since initial HIV diagnosis, no acute changes per patient). Landis Gandy, RN

## 2020-09-09 LAB — PROTEIN ELECTROPHORESIS, SERUM, WITH REFLEX
A/G Ratio: 0.4 — ABNORMAL LOW (ref 0.7–1.7)
Albumin ELP: 2.7 g/dL — ABNORMAL LOW (ref 2.9–4.4)
Alpha-1-Globulin: 0.3 g/dL (ref 0.0–0.4)
Alpha-2-Globulin: 0.8 g/dL (ref 0.4–1.0)
Beta Globulin: 1.4 g/dL — ABNORMAL HIGH (ref 0.7–1.3)
Gamma Globulin: 4.1 g/dL — ABNORMAL HIGH (ref 0.4–1.8)
Globulin, Total: 6.6 g/dL — ABNORMAL HIGH (ref 2.2–3.9)
Total Protein ELP: 9.3 g/dL — ABNORMAL HIGH (ref 6.0–8.5)

## 2020-09-10 ENCOUNTER — Telehealth: Payer: Self-pay | Admitting: Physician Assistant

## 2020-09-10 ENCOUNTER — Other Ambulatory Visit: Payer: Self-pay

## 2020-09-10 ENCOUNTER — Encounter: Payer: Self-pay | Admitting: Internal Medicine

## 2020-09-10 ENCOUNTER — Other Ambulatory Visit: Payer: Self-pay | Admitting: Physician Assistant

## 2020-09-10 ENCOUNTER — Ambulatory Visit (INDEPENDENT_AMBULATORY_CARE_PROVIDER_SITE_OTHER): Payer: 59 | Admitting: Internal Medicine

## 2020-09-10 VITALS — BP 134/86 | HR 94 | Temp 97.6°F | Ht 72.0 in | Wt 149.0 lb

## 2020-09-10 DIAGNOSIS — B20 Human immunodeficiency virus [HIV] disease: Secondary | ICD-10-CM

## 2020-09-10 DIAGNOSIS — J069 Acute upper respiratory infection, unspecified: Secondary | ICD-10-CM

## 2020-09-10 DIAGNOSIS — D573 Sickle-cell trait: Secondary | ICD-10-CM

## 2020-09-10 LAB — HGB FRACTIONATION CASCADE
Hgb A2: 3.6 % — ABNORMAL HIGH (ref 1.8–3.2)
Hgb A: 62.9 % — ABNORMAL LOW (ref 96.4–98.8)
Hgb F: 0 % (ref 0.0–2.0)
Hgb S: 33.5 % — ABNORMAL HIGH

## 2020-09-10 LAB — HGB SOLUBILITY: Hgb Solubility: POSITIVE — AB

## 2020-09-10 MED ORDER — SULFAMETHOXAZOLE-TRIMETHOPRIM 400-80 MG PO TABS: 1.0000 | ORAL_TABLET | Freq: Every day | ORAL | 1 refills | Status: DC

## 2020-09-10 MED ORDER — DORAVIRINE 100 MG PO TABS: 100.0000 mg | ORAL_TABLET | Freq: Every day | ORAL | 1 refills | Status: DC

## 2020-09-10 MED ORDER — DOLUTEGRAVIR SODIUM 50 MG PO TABS: 50.0000 mg | ORAL_TABLET | Freq: Two times a day (BID) | ORAL | 1 refills | Status: DC

## 2020-09-10 NOTE — Progress Notes (Signed)
Page for Infectious Disease  Reason for Consult:HIV care     Patient Active Problem List   Diagnosis Date Noted  . HIV disease ([place]) 08/04/2020  . Giardia 08/04/2020  . Diarrhea, infectious, adult 08/04/2020  . Iron deficiency anemia 08/04/2020  . Healthcare maintenance 08/04/2020  . Mycobacterium avium infection ([place])     Patient's Medications  New Prescriptions   No medications on file  Previous Medications   DARUNAVIR (PREZISTA) 600 MG TABLET    Take 1 tablet (600 mg total) by mouth 2 (two) times daily.   DOLUTEGRAVIR (TIVICAY) 50 MG TABLET    Take 1 tablet (50 mg total) by mouth daily.   FERROUS SULFATE 325 (65 FE) MG TABLET    Take by mouth.   FLUCONAZOLE (DIFLUCAN) 100 MG TABLET    Take 4 tablets (400 mg total) by mouth daily for 14 days.   FOLIC ACID (FOLVITE) 1 MG TABLET    Take by mouth.   IBUPROFEN (ADVIL) 200 MG TABLET    Take 400 mg by mouth every 6 (six) hours as needed for headache or mild pain.   RITONAVIR (NORVIR) 100 MG TABS TABLET    Take 1 tablet (100 mg total) by mouth 2 (two) times daily.   THIAMINE 50 MG TABLET    Take by mouth.  Modified Medications   No medications on file  Discontinued Medications   No medications on file    HPI: Paul Serrano is a 24 y.o. male hx paranoid schizophrenia, hiv/aids here to establish care  He was previously cared for at [facility]. He goes to school at [employer] so trying to get care for here  09/10/2020 f/u I continued him on bid prezista and daily tivicay. However upon review of his serial genotype, there was 1 major/1 accessory INI RAM with intermediate resistance. 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 fungitell not back for some reason, but cxr clear. No fever, chill. He feels well otherwise. No rash/joint  pain/musle ache, headache   background ---------------- HIV/AIDS: dxed 2019 -- had mental health issue and had screening test so incidental finding.  Had some kind of pneumonia during 02/2018 hospitalized for 1 month -- recalled the name of bactrim Risk factors: no hx ivdu; had a tatoo; no blood transfusion; not many sexual partners in the past; bisexual. Recall cd4 nadir "really low." Therapy Prezcobix/descovy and etravirine -- vl undetectable... this regimen for a year Other therapies since 2.5 years prior to this visit.   No missed dose the past week.   I reviewed medical records sent: -hiv dx 01/2018; bixexual; disseminated MAC hx (by lymph node biopsy -- [facility]); cd4 nadir 30; initial viral load 985k -baseline labs; hep A ab positive; hep B sAg, sAb, cAb negative; hep C ab negative; cmv igg positive; g6pd not deficient; [id number] negative; toxoplasma negative; quant gold negative -last annual screening rpr negative 2020 -HIV genotypes: Initial 01/2018 pansensitivie 04/2018 (on meds of 2 months) - Ftc, abc, 3tc, tivicay, biktegravir/elvitigravir resistant 07/26/2019 (on meds) NRTI - S abc, didanosine, ftc, ltc, stavudine, tdf, zdt; RAM's V118I,  NNRTI - S doravirine, sustiva, Etravirine, nevirapine; R rilpivirine; RAM's e138A, I178L INI - S bictegravir, elvitegravir, Raltegravir (no RAM's) PI - S ATV, drv, fosamprenavir, Indinavir, Lopinavir, Nelfinavir, Ritonavir,  Saquinavir, Tipranavir -ART starts 03/15/18 -therapy 09/06/18-current prezcobix, descovy, etravirine; last note 07/2019 menions no missed dose 04/06/18-09/05/18 lamivudine/dolutebravir  -- virologic failure 03/15/18-04/06/18 biktarvy  -- ddi -hiv labs 07/26/2019 cd4 589 (14%), VL 3140  01/09/2019 cd4 140 and viral load 4.5 million (off meds for some time prior to that) 09/2018 cd4 370 (23%), VL 30k 03/2018 cd4 459, VL 1.06 million  Social: -born/raised in [place]; no prior travel out of state -have a dog "[name]" of 4  months old as of 08/2020 -hobbies singing; church choir; no outdoor activities; no hunting/fishing -lives alone in [place] -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 -no smoking, drinking, substance use including marijanna -2 sisters in [place] -no hx homelessness/incarceration -not in a relationship at this time -last time sexually active June 2021  Labs reviewed from 07/2020  Quant gold indeterminate Patient's test was positive for specific/nonsepcific titer. No prior tx or known exposure. Doesn't recall any rash on penis/body   covid vaccines: 12/2019 finished moderna vaccine   Review of Systems: ROS New facial rash Coughing for a few days Denies headache, visual change, nightsweat, weight loss Appetite intact No lump/bump on body No joint pain/muscle pain/weakness No sob No n/v/diarrhea/abd pain No penile dishcarge, dysuria No numbness/tingling No easy bruising/bleeding No HI/SI  Past Medical History:  Diagnosis Date  . HIV (human immunodeficiency virus infection) ([place])     Social History   Tobacco Use  . Smoking status: Never Smoker  . Smokeless tobacco: Never Used  Substance Use Topics  . Alcohol use: Never  . Drug use: Never    No family history on file. No Known Allergies  OBJECTIVE: Vitals:   09/10/20 1410  BP: 134/86  Pulse: 94  Temp: 97.6 F (36.4 C)  TempSrc: Oral  SpO2: 98%  Weight: 149 lb (67.6 kg)  Height: 6' (1.829 m)   Body mass index is 20.21 kg/m.   Physical Exam General: alert/oriented; no distress Psych: doesn't seem to respond to internal stimulit; conversant; interactive; cooperative HEENT: normocephalic; per; ocnj clear; oropharynx thrush Skin: facial seborrheic dermatitis  cv rrr no mrg Lungs clear abd s/nt Ext no edema Neuro cn2-12 intact; strength/reflex intact; no tremor Back nontender Lymph no cervical/ingiunal/axillary lymphadenopathy  Lab: 08/18/2020 Cbc 5/8/263; cr 0.65; lft  26/8; tbili 0.3    Microbiology: No results found for this or any previous visit (from the past 240 hour(s)).  Serology: RPR 1:1 fta positive Hep A ab reactive Hep B sAb, cAb, sAb nonreactive Hep C cAb nonreactive Hiv 5.8 million units cd4 55 (5%)  Imaging: 11/2 cxr clear  Assessment/plan: 24 yo male hx paranoid schizophrenia, aids, here to establish care   Patient Active Problem List   Diagnosis Date Noted  . HIV disease ([place]) 08/04/2020  . Giardia 08/04/2020  . Diarrhea, infectious, adult 08/04/2020  . Iron deficiency anemia 08/04/2020  . Healthcare maintenance 08/04/2020  . 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 Genotype on biktarvy showed resistance to all tivicay, biktarvy, multiple nrti, but no PI resistance known. Subsequently switched to dovato (perhaps no genotype data yet by then), and currently prezcobix/etravirine/descovy. Viral load in the millions 07/2018 and cd4 55. Last genotype 07/2019 sensitive to several nrti, nnrti, PI, and INI.  -Given INI resistance, I will switch him to bid prezista/r, doravirine, and bid tivicay -rukobia and other fusion/entry inhibitor potentially could  be used in future if needed; he doesn't have chronic hep b -discussed u=u -discussed compliant but might need to investigate mental health -f/u archive genotype -f/u 4 weeks   #cough -low cd4 will keep broad ddx; not septic; remains well otherwise. cxr clear -sputum cx, fungitell, covid/viral respiratory swab -start bactrim ss daily prophy -f/u 4 weeks  #syphilis -latent infection presumption per history (no rash). 3 shots benzathine pcn weekly done  #hx schizo -not on meds -will need to investigate more (see above for concern)  #hcm Will review vaccine Await improvement cd4 prior to starting hep b vaccine   I am having Gaspar Bidding maintain his ibuprofen, Tivicay,  ritonavir, darunavir, fluconazole, ferrous sulfate, folic acid, and thiamine.   No orders of the defined types were placed in this encounter.    Follow-up: No follow-ups on file.  Jabier Mutton, [place] for Infectious Disease [place] -- -- pager   [phone] cell 09/10/2020, 3:18 PM

## 2020-09-10 NOTE — Telephone Encounter (Signed)
I called the patient to let him know that his labs suggest that he has sickle cell trait. Discussed I would arrange for a lab appointment in about 2 weeks and a follow up visit so we can monitor his anemia and discuss this in more detail. He expressed understanding.

## 2020-09-10 NOTE — Patient Instructions (Addendum)
We reviewed your HIV virus resistance pattern, and it doesn't appear your current regimen is working  Will make this switch for now, and might be able to reduce the number of pills later   Prezista/ritonavir twice a day tivicay twice a day doravirine once a day   Do sputum culture and blood test and nasal swab today to see what is the cause for your cough. Your chest xray previously was normal  Please see [place] in 4 weeks, will get your viral load again at that time  In the mean time, will also start you on bactrim for pneumonia prophylaxis

## 2020-09-10 NOTE — Progress Notes (Signed)
HPI: Paul Serrano is a 24 y.o. male who presents to the [facility] pharmacy clinic for HIV follow-up.  Patient Active Problem List   Diagnosis Date Noted  . HIV disease ([facility]) 08/04/2020  . Giardia 08/04/2020  . Diarrhea, infectious, adult 08/04/2020  . Iron deficiency anemia 08/04/2020  . Healthcare maintenance 08/04/2020  . 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   DORAVIRINE (PIFELTRO) 100 MG TABS TABLET    Take 1 tablet (100 mg total) by mouth daily.   SULFAMETHOXAZOLE-TRIMETHOPRIM (BACTRIM) 400-80 MG TABLET    Take 1 tablet by mouth daily.  Previous Medications   DARUNAVIR (PREZISTA) 600 MG TABLET    Take 1 tablet (600 mg total) by mouth 2 (two) times daily.   FERROUS SULFATE 325 (65 FE) MG TABLET    Take by mouth.   FLUCONAZOLE (DIFLUCAN) 100 MG TABLET    Take 4 tablets (400 mg total) by mouth daily for 14 days.   FOLIC ACID (FOLVITE) 1 MG TABLET    Take by mouth.   IBUPROFEN (ADVIL) 200 MG TABLET    Take 400 mg by mouth every 6 (six) hours as needed for headache or mild pain.   RITONAVIR (NORVIR) 100 MG TABS TABLET    Take 1 tablet (100 mg total) by mouth 2 (two) times daily.   THIAMINE 50 MG TABLET    Take by mouth.  Modified Medications   No medications on file  Discontinued Medications   DOLUTEGRAVIR (TIVICAY) 50 MG TABLET    Take 1 tablet (50 mg total) by mouth daily.    Allergies: No Known Allergies  Past Medical History: Past Medical History:  Diagnosis Date  . HIV (human immunodeficiency virus infection) ([facility])     Social History: Social History   Socioeconomic History  . Marital status: Single    Spouse name: Not on file  . Number of children: Not on file  . Years of education: Not on file  . Highest education level: Not on file  Occupational History  . Not on file  Tobacco Use  . Smoking status: Never Smoker  .  Smokeless tobacco: Never Used  Substance and Sexual Activity  . Alcohol use: Never  . Drug use: Never  . Sexual activity: Not on file  Other Topics Concern  . Not on file  Social History Narrative  . Not on file   Social Determinants of Health   Financial Resource Strain:   . Difficulty of Paying Living Expenses: Not on file  Food Insecurity:   . Worried About Programme researcher, broadcasting/film/video in the Last Year: Not on file  . Ran Out of Food in the Last Year: Not on file  Transportation Needs:   . Lack of Transportation (Medical): Not on file  . Lack of Transportation (Non-Medical): Not on file  Physical Activity:   . Days of Exercise per Week: Not on file  . Minutes of Exercise per Session: Not on file  Stress:   . Feeling of Stress : Not on file  Social Connections:   . Frequency of Communication with Friends and Family: Not on file  . Frequency of Social Gatherings with Friends and Family: Not on file  . Attends Religious Services: Not on file  . Active Member of Clubs or Organizations: Not on file  . Attends Club or  Organization Meetings: Not on file  . Marital Status: Not on file    Labs: Lab Results  Component Value Date   HIV1RNAQUANT 5,800,000 (H) 08/18/2020   CD4TABS 55 (L) 08/18/2020    RPR and STI Lab Results  Component Value Date   LABRPR REACTIVE (A) 08/18/2020   RPRTITER 1:1 (H) 08/18/2020    No flowsheet data found.  Hepatitis B Lab Results  Component Value Date   HEPBSAB NON-REACTIVE 08/18/2020   HEPBSAG NON-REACTIVE 08/18/2020   HEPBCAB NON-REACTIVE 08/18/2020   Hepatitis C Lab Results  Component Value Date   HEPCAB NON-REACTIVE 08/18/2020   Hepatitis A Lab Results  Component Value Date   HAV REACTIVE (A) 08/18/2020   Lipids: Lab Results  Component Value Date   CHOL 65 08/18/2020   TRIG 81 08/18/2020   HDL 24 (L) 08/18/2020   CHOLHDL 2.7 08/18/2020   LDLCALC 25 08/18/2020   Current HIV Regimen: Tivicay once daily with Prezista/ritonavir  twice daily  Assessment: Nitin presents today to clinic today to transfer HIV care with Dr. Renold Don. Last week Dr. Renold Don started him on the above regimen pending his genotyping since his genotype from September showed no resistance. His genotype from last week now shows resistance to most NNRTIs and NRTIs without any PI resistance. Dr. Renold Don is updating his regimen to Tivicay twice daily + doravirine in addition to his Prezista/ritonavir twice daily pending a repeat genotype assessment. I reviewed his previous regimen and discussed the importance of separating Tivicay from his iron tablets by at least 4 hours and now taking twice daily. Reviewed doravirine as generally well tolerated without any significant drug interactions for him. Dr. Renold Don is also starting him on once daily Bactrim prophylaxis due to his CD4 count of 55. Due to his pill burden, offered pillboxes to remember his twice daily Tivicay, Prezista and ritonavir. He accepted the pillboxes.  Of note, he is completing 2 weeks of fluconazole right now which could increase the ritonavir; however, I have minimal concerns given the shortened duration with fluconazole.  Plan: Counseled patient on Doravirine and Bactrim Reviewed Tivicay twice daily dosing and continuing Prezista/ritonavir  Margarite Gouge, PharmD PGY2 ID Pharmacy Resident [phone]

## 2020-09-11 ENCOUNTER — Telehealth: Payer: Self-pay | Admitting: Physician Assistant

## 2020-09-11 LAB — HIV-1 GENOTYPING (RTI,PI,IN INHBTR): HIV-1 Genotype: DETECTED — AB

## 2020-09-11 LAB — FUNGITELL (1-3)-B-D-GLUCAN
(1-3)-B-D-Glucan: 65 pg/mL — ABNORMAL HIGH (ref <60)
Interpretation: UNDETERMINED — AB

## 2020-09-11 LAB — LACTATE DEHYDROGENASE: LDH: 123 U/L (ref 100–220)

## 2020-09-11 NOTE — Telephone Encounter (Signed)
Scheduled appt per 11/12 sch msg - pt is aware of appt date and time

## 2020-09-15 ENCOUNTER — Ambulatory Visit: Payer: 59

## 2020-09-15 ENCOUNTER — Ambulatory Visit: Payer: 59 | Admitting: Internal Medicine

## 2020-09-15 ENCOUNTER — Other Ambulatory Visit: Payer: Self-pay

## 2020-09-15 DIAGNOSIS — A539 Syphilis, unspecified: Secondary | ICD-10-CM

## 2020-09-15 MED ORDER — PENICILLIN G BENZATHINE 1200000 UNIT/2ML IM SUSP: 1.2000 10*6.[IU] | Freq: Once | INTRAMUSCULAR | Status: AC

## 2020-09-15 NOTE — Progress Notes (Signed)
Bicillin 2.4 million units IM 3 of 3 today. LPN offered condoms and advised patient to remain abstinent for 7-10 days after treatment.  Patient met with Audelia Hives after treatment. Patient's questions answered.  Eugenia Mcalpine

## 2020-09-18 ENCOUNTER — Telehealth: Payer: Self-pay

## 2020-09-18 LAB — SARS-COV-2 RNA (COVID-19) RESP VIRAL PNL QL NAAT

## 2020-09-18 LAB — PROCALCITONIN: Procalcitonin: 0.12 ng/mL — ABNORMAL HIGH (ref <0.10)

## 2020-09-18 LAB — FUNGITELL (1-3)-B-D-GLUCAN
(1-3)-B-D-Glucan: 131 pg/mL — ABNORMAL HIGH (ref <60)
Interpretation: POSITIVE — AB

## 2020-09-18 LAB — LACTATE DEHYDROGENASE: LDH: 124 U/L (ref 100–220)

## 2020-09-18 NOTE — Telephone Encounter (Signed)
Received call from patient stating he received labs back, but did not understand what they meant. Labs in question are Fungitell. Patient states he has been taking his Bactrim as prescribed, but that his cough seems to be getting worse and he has started to lose his voice. RN relayed result note per Dr. Renold Don that the patient's Fungitell lab had a slight rise, but was not markedly high and that his chest Xray was clear. RN advised that Dr. Renold Don plans to continue monitoring patient on ART and that Dr. Renold Don does not think he has PJP pneumonia. Will route to Dr. Renold Don for advice.  Sandie Ano, RN

## 2020-09-22 ENCOUNTER — Other Ambulatory Visit: Payer: 59

## 2020-09-29 ENCOUNTER — Inpatient Hospital Stay: Payer: 59

## 2020-09-29 ENCOUNTER — Other Ambulatory Visit: Payer: Self-pay

## 2020-09-29 ENCOUNTER — Encounter: Payer: Self-pay | Admitting: Internal Medicine

## 2020-09-29 ENCOUNTER — Inpatient Hospital Stay (HOSPITAL_BASED_OUTPATIENT_CLINIC_OR_DEPARTMENT_OTHER): Payer: 59 | Admitting: Internal Medicine

## 2020-09-29 DIAGNOSIS — D573 Sickle-cell trait: Secondary | ICD-10-CM | POA: Diagnosis not present

## 2020-09-29 DIAGNOSIS — D649 Anemia, unspecified: Secondary | ICD-10-CM | POA: Diagnosis not present

## 2020-09-29 LAB — CBC WITH DIFFERENTIAL (CANCER CENTER ONLY)
Abs Immature Granulocytes: 0.01 10*3/uL (ref 0.00–0.07)
Basophils Absolute: 0 10*3/uL (ref 0.0–0.1)
Basophils Relative: 0 %
Eosinophils Absolute: 0 10*3/uL (ref 0.0–0.5)
Eosinophils Relative: 0 %
HCT: 27.5 % — ABNORMAL LOW (ref 39.0–52.0)
Hemoglobin: 8.6 g/dL — ABNORMAL LOW (ref 13.0–17.0)
Immature Granulocytes: 0 %
Lymphocytes Relative: 30 %
Lymphs Abs: 1.7 10*3/uL (ref 0.7–4.0)
MCH: 25.5 pg — ABNORMAL LOW (ref 26.0–34.0)
MCHC: 31.3 g/dL (ref 30.0–36.0)
MCV: 81.6 fL (ref 80.0–100.0)
Monocytes Absolute: 0.7 10*3/uL (ref 0.1–1.0)
Monocytes Relative: 11 %
Neutro Abs: 3.4 10*3/uL (ref 1.7–7.7)
Neutrophils Relative %: 59 %
Platelet Count: 349 10*3/uL (ref 150–400)
RBC: 3.37 MIL/uL — ABNORMAL LOW (ref 4.22–5.81)
RDW: 14.8 % (ref 11.5–15.5)
WBC Count: 5.8 10*3/uL (ref 4.0–10.5)
nRBC: 0 % (ref 0.0–0.2)

## 2020-09-29 LAB — RETIC PANEL
Immature Retic Fract: 19.1 % — ABNORMAL HIGH (ref 2.3–15.9)
RBC.: 3.34 MIL/uL — ABNORMAL LOW (ref 4.22–5.81)
Retic Count, Absolute: 21 10*3/uL (ref 19.0–186.0)
Retic Ct Pct: 0.6 % (ref 0.4–3.1)
Reticulocyte Hemoglobin: 28.6 pg (ref >27.9)

## 2020-09-29 LAB — SAMPLE TO BLOOD BANK

## 2020-09-29 LAB — LACTATE DEHYDROGENASE: LDH: 167 U/L (ref 98–192)

## 2020-09-29 NOTE — Progress Notes (Signed)
[facility] [phone]   [phone]  OFFICE PROGRESS NOTE  Patient, No Pcp Per No address on file  DIAGNOSIS: Sickle cell trait with anemia of chronic disease.  PRIOR THERAPY: None  CURRENT THERAPY: None.  INTERVAL HISTORY: Paul Serrano 24 y.o. male returns to the clinic today for follow-up visit.  The patient is feeling fine today with no concerning complaints except for fatigue.  He denied having any current chest pain, shortness of breath, cough or hemoptysis.  He denied having any nausea, vomiting, diarrhea or constipation.  He has no headache or visual changes.  He had several studies for evaluation of his anemia and his iron study were unremarkable.  His ferritin level was elevated as an acute phase reactant.  The patient had hemoglobin electrophoresis that showed the evidence for sickle cell trait.  He is here today for evaluation and repeat blood work including haptoglobin, G6PD and repeat CBC.  MEDICAL HISTORY: Past Medical History:  Diagnosis Date   HIV (human immunodeficiency virus infection) ([facility])     ALLERGIES:  has No Known Allergies.  MEDICATIONS:  Current Outpatient Medications  Medication Sig Dispense Refill   darunavir (PREZISTA) 600 MG tablet Take 1 tablet (600 mg total) by mouth 2 (two) times daily. 60 tablet 1   dolutegravir (TIVICAY) 50 MG tablet Take 1 tablet (50 mg total) by mouth 2 (two) times daily. 60 tablet 1   doravirine (PIFELTRO) 100 MG TABS tablet Take 1 tablet (100 mg total) by mouth daily. 30 tablet 1   ferrous sulfate 325 (65 FE) MG tablet Take by mouth.     folic acid (FOLVITE) 1 MG tablet Take by mouth.     ibuprofen (ADVIL) 200 MG tablet Take 400 mg by mouth every 6 (six) hours as needed for headache or mild pain.     ritonavir (NORVIR) 100 MG TABS tablet Take 1 tablet (100 mg total) by mouth 2 (two) times daily. 60 tablet 1   sulfamethoxazole-trimethoprim (BACTRIM) 400-80 MG tablet Take 1  tablet by mouth daily. 30 tablet 1   thiamine 50 MG tablet Take by mouth.     Current Facility-Administered Medications  Medication Dose Route Frequency Provider Last Rate Last Admin   penicillin g benzathine (BICILLIN LA) 1200000 UNIT/2ML injection 1.2 Million Units  1.2 Million Units Intramuscular Once Vu, Trung T, MD       penicillin g benzathine (BICILLIN LA) 1200000 UNIT/2ML injection 1.2 Million Units  1.2 Million Units Intramuscular Once Vu, Tonita [place], MD        SURGICAL HISTORY: History reviewed. No pertinent surgical history.  REVIEW OF SYSTEMS:  A comprehensive review of systems was negative except for: Constitutional: positive for fatigue   PHYSICAL EXAMINATION: General appearance: alert, cooperative, fatigued and no distress Head: Normocephalic, without obvious abnormality, atraumatic Neck: no adenopathy, no JVD, supple, symmetrical, trachea midline and thyroid not enlarged, symmetric, no tenderness/mass/nodules Lymph nodes: Cervical, supraclavicular, and axillary nodes normal. Resp: clear to auscultation bilaterally Back: symmetric, no curvature. ROM normal. No CVA tenderness. Cardio: regular rate and rhythm, S1, S2 normal, no murmur, click, rub or gallop GI: soft, non-tender; bowel sounds normal; no masses,  no organomegaly Extremities: extremities normal, atraumatic, no cyanosis or edema  ECOG PERFORMANCE STATUS: 1 - Symptomatic but completely ambulatory  Blood pressure 106/66, pulse (!) 112, temperature 97.8 F (36.6 C), temperature source Tympanic, resp. rate 18, height 6' (1.829 m), weight 148 lb 3.2 oz (67.2 kg), SpO2 100 %.  LABORATORY DATA: Lab Results  Component Value Date   WBC 5.8 09/29/2020   HGB 8.6 (L) 09/29/2020   HCT 27.5 (L) 09/29/2020   MCV 81.6 09/29/2020   PLT 349 09/29/2020      Chemistry      Component Value Date/Time   NA 144 09/08/2020 1349   K 3.4 (L) 09/08/2020 1349   CL 108 09/08/2020 1349   CO2 30 09/08/2020 1349   BUN 5 (L)  09/08/2020 1349   CREATININE 0.91 09/08/2020 1349   CREATININE 0.65 08/18/2020 1012      Component Value Date/Time   CALCIUM 8.8 (L) 09/08/2020 1349   ALKPHOS 152 (H) 09/08/2020 1349   AST 18 09/08/2020 1349   ALT <6 09/08/2020 1349   BILITOT 0.3 09/08/2020 1349       RADIOGRAPHIC STUDIES: DG Chest 2 View  Result Date: 09/02/2020 CLINICAL DATA:  Cough EXAM: CHEST - 2 VIEW COMPARISON:  None. FINDINGS: The heart size and mediastinal contours are within normal limits. Both lungs are clear. The visualized skeletal structures are unremarkable. IMPRESSION: Negative Electronically Signed   By: Charlett Nose M.D.   On: 09/02/2020 23:13    ASSESSMENT AND PLAN: This is a very pleasant 24 years old African-American male recently diagnosed with sickle cell trait as well as anemia of chronic disease. The patient is feeling fine today with no concerning issues except for mild fatigue. I discussed the lab results with the patient today and recommended for him to continue on observation in addition to daily multivitamin. I will see him on as-needed basis at this point and he will continue his routine follow-up visit and evaluation by his primary care physician. The patient was advised to call immediately if he has any other concerning symptoms in the interval. The patient voices understanding of current disease status and treatment options and is in agreement with the current care plan.  All questions were answered. The patient knows to call the clinic with any problems, questions or concerns. We can certainly see the patient much sooner if necessary.   Disclaimer: This note was dictated with voice recognition software. Similar sounding words can inadvertently be transcribed and may not be corrected upon review.

## 2020-09-30 LAB — HAPTOGLOBIN: Haptoglobin: 244 mg/dL (ref 17–317)

## 2020-09-30 LAB — GLUCOSE 6 PHOSPHATE DEHYDROGENASE
G6PDH: 9.9 U/g{Hb} (ref 7.2–14.2)
Hemoglobin: 8.8 g/dL — ABNORMAL LOW (ref 13.0–17.7)

## 2020-10-09 ENCOUNTER — Ambulatory Visit: Payer: 59 | Admitting: Internal Medicine

## 2020-10-09 ENCOUNTER — Telehealth: Payer: Self-pay | Admitting: Internal Medicine

## 2020-10-09 NOTE — Telephone Encounter (Signed)
Called patient to get appointment rescheduled, left a voicemail for patient to call back and get it rescheduled

## 2020-10-15 ENCOUNTER — Telehealth: Payer: Self-pay

## 2020-10-15 NOTE — Telephone Encounter (Signed)
-----   Message from Raymondo Band, MD sent at 10/15/2020  2:28 PM EST ----- Hi team  I don't see an appointment for this patient with [place] yet  Could we get him in next week or 10/2020 (preferably sooner) to see me  Thank you

## 2020-10-15 NOTE — Telephone Encounter (Signed)
Attempted to call patient to schedule labs and office visit. No answer, RN left HIPAA complient message requesting callback.   Sandie Ano, RN

## 2020-10-21 NOTE — Telephone Encounter (Addendum)
I attempted to contact patient today. Patient did not answer and voicemail not setup. Our office has tried to contact the patient several times to get him in the office. Please advise what the next step should be Dr. Renold Don. Bhavika Schnider T Pricilla Loveless

## 2020-10-27 NOTE — Telephone Encounter (Signed)
Patient's information has been placed [facility] folder for Mitch to try to get in contact with the patient. Paul Serrano T Pricilla Loveless

## 2020-10-28 NOTE — Telephone Encounter (Signed)
Thank you :)

## 2020-12-28 ENCOUNTER — Other Ambulatory Visit: Payer: Self-pay

## 2020-12-28 ENCOUNTER — Telehealth: Payer: Self-pay

## 2020-12-28 DIAGNOSIS — B20 Human immunodeficiency virus [HIV] disease: Secondary | ICD-10-CM

## 2020-12-28 MED ORDER — DORAVIRINE 100 MG PO TABS: 100.0000 mg | ORAL_TABLET | Freq: Every day | ORAL | 0 refills | Status: DC

## 2020-12-28 MED ORDER — DOLUTEGRAVIR SODIUM 50 MG PO TABS: 50.0000 mg | ORAL_TABLET | Freq: Two times a day (BID) | ORAL | 0 refills | Status: DC

## 2020-12-28 MED ORDER — RITONAVIR 100 MG PO TABS: 100.0000 mg | ORAL_TABLET | Freq: Two times a day (BID) | ORAL | 0 refills | Status: DC

## 2020-12-28 MED ORDER — DARUNAVIR ETHANOLATE 600 MG PO TABS: 600.0000 mg | ORAL_TABLET | Freq: Two times a day (BID) | ORAL | 0 refills | Status: DC

## 2020-12-28 MED ORDER — SULFAMETHOXAZOLE-TRIMETHOPRIM 400-80 MG PO TABS: 1.0000 | ORAL_TABLET | Freq: Every day | ORAL | 1 refills | Status: AC

## 2020-12-28 NOTE — Telephone Encounter (Signed)
Received call from patient requesting refills on HIV medication. States that he has moved back in with his mom, and has been off medication for two weeks. Front desk schedule follow up appointment with for 3/16 with labs same day.  Will forward message to MD to advise if okay to refill. Pt is requesting refills be sent to [facility] in [place], [place].

## 2020-12-28 NOTE — Telephone Encounter (Signed)
[facility]ills to [facility]. Patient is aware he must come to upcoming appt for future refills

## 2021-01-13 ENCOUNTER — Ambulatory Visit (INDEPENDENT_AMBULATORY_CARE_PROVIDER_SITE_OTHER): Payer: 59 | Admitting: Internal Medicine

## 2021-01-13 ENCOUNTER — Encounter: Payer: Self-pay | Admitting: Internal Medicine

## 2021-01-13 ENCOUNTER — Other Ambulatory Visit (HOSPITAL_COMMUNITY)
Admission: RE | Admit: 2021-01-13 | Discharge: 2021-01-13 | Disposition: A | Discharge status: Home / Self Care | Payer: 59 | Source: Ambulatory Visit | Attending: Internal Medicine | Admitting: Internal Medicine

## 2021-01-13 ENCOUNTER — Other Ambulatory Visit: Payer: Self-pay

## 2021-01-13 ENCOUNTER — Telehealth: Payer: Self-pay

## 2021-01-13 VITALS — BP 99/65 | HR 122 | Temp 97.9°F | Resp 16 | Ht 70.0 in | Wt 143.6 lb

## 2021-01-13 DIAGNOSIS — B20 Human immunodeficiency virus [HIV] disease: Secondary | ICD-10-CM

## 2021-01-13 DIAGNOSIS — Z113 Encounter for screening for infections with a predominantly sexual mode of transmission: Secondary | ICD-10-CM | POA: Diagnosis not present

## 2021-01-13 DIAGNOSIS — Z111 Encounter for screening for respiratory tuberculosis: Secondary | ICD-10-CM

## 2021-01-13 DIAGNOSIS — R059 Cough, unspecified: Secondary | ICD-10-CM | POA: Diagnosis not present

## 2021-01-13 DIAGNOSIS — G9340 Encephalopathy, unspecified: Secondary | ICD-10-CM | POA: Insufficient documentation

## 2021-01-13 NOTE — Telephone Encounter (Signed)
[facility] Patient Advocate Encounter   Was successful in obtaining a [employer] copay card for [employer].  This copay card will make the patients copay 0.00.  I have spoken with the patient.      RxBin: [id number] PCN: [id number] Member ID: [id number] Group ID: [id number]  [name], CPhT Specialty Pharmacy Patient Advocate [facility] for Infectious Disease Phone: [phone] Fax:  [phone]

## 2021-01-13 NOTE — Progress Notes (Addendum)
Paul Serrano for Infectious Disease  Reason for Consult:HIV care     Patient Active Problem List   Diagnosis Date Noted  . Encephalopathy 01/13/2021  . Sickle cell trait ([place]) 09/29/2020  . HIV disease ([place]) 08/04/2020  . Giardia 08/04/2020  . Diarrhea, infectious, adult 08/04/2020  . Iron deficiency anemia 08/04/2020  . Healthcare maintenance 08/04/2020  . Mycobacterium avium infection ([place])   . Schizophrenia ([place]) 07/14/2020  . Paul ([place]) 04/14/2018  . Anemia 04/14/2018  . 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 Pulmonary nodules 04/04/2018  . Pulmonary infiltrate 03/14/2018  . Altered mental status 03/11/2018    Patient's Medications  New Prescriptions   No medications on file  Previous Medications   DARUNAVIR (PREZISTA) 600 MG TABLET    Take 1 tablet (600 mg total) by mouth 2 (two) times daily.   DOLUTEGRAVIR (TIVICAY) 50 MG TABLET    Take 1 tablet (50 mg total) by mouth 2 (two) times daily.   DORAVIRINE (PIFELTRO) 100 MG TABS TABLET    Take 1 tablet (100 mg total) by mouth daily.   FERROUS SULFATE 325 (65 FE) MG TABLET    Take by mouth.   FOLIC ACID (FOLVITE) 1 MG TABLET    Take by mouth.   IBUPROFEN (ADVIL) 200 MG TABLET    Take 400 mg by mouth every 6 (six) hours as needed for headache or mild pain.   RITONAVIR (NORVIR) 100 MG TABS TABLET    Take 1 tablet (100 mg total) by mouth 2 (two) times daily.   SULFAMETHOXAZOLE-TRIMETHOPRIM (BACTRIM) 400-80 MG TABLET    Take 1 tablet by mouth daily.   THIAMINE 50 MG TABLET    Take by mouth.  Modified Medications   No medications on file  Discontinued Medications   No medications on file    HPI: Paul Serrano is a 25 y.o. male hx paranoid schizophrenia, hiv/Paul here to f/u hiv management  01/13/2021 f/u He last saw me in 08/2020.  He has no hiv labs since 07/2020 He had much improved cough and only intermittently coughs now more like clearing throat. 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 However upon review of his serial genotype, there was 1 major/1 accessory INI RAM with intermediate resistance. 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 fungitell not back for some reason, but cxr clear. No fever, chill. He feels well otherwise. No rash/joint pain/musle ache, headache   background ---------------- He previously get care in [place], but transitioned to [place] due to school  HIV/Paul: dxed 2019 -- had mental health issue and had screening test so incidental finding.  Had some kind of pneumonia during 02/2018 hospitalized for 1 month -- recalled the name of bactrim Risk factors: no hx ivdu; had a tatoo; no  blood transfusion; not many sexual partners in the past; bisexual. Recall cd4 nadir "really low." Therapy Prezcobix/descovy and etravirine -- vl undetectable... this regimen for a year Other therapies since 2.5 years prior to this visit.   No missed dose the past week.   I reviewed medical records sent: -hiv dx 01/2018; bixexual; disseminated MAC hx (by lymph node biopsy -- [facility]); cd4 nadir 30; initial viral load 985k -baseline labs; hep A ab positive; hep B sAg, sAb, cAb negative; hep C ab negative; cmv igg positive; g6pd not deficient;  [id number] negative; toxoplasma negative; quant gold negative -last annual screening rpr negative 2020 -HIV genotypes: Initial 01/2018 pansensitivie 04/2018 (on meds of 2 months) - Ftc, abc, 3tc, tivicay, biktegravir/elvitigravir resistant 07/26/2019 (on meds) NRTI - S abc, didanosine, ftc, ltc, stavudine, tdf, zdt; RAM's V118I,  NNRTI - S doravirine, sustiva, Etravirine, nevirapine; R rilpivirine; RAM's e138A, I178L INI - S bictegravir, elvitegravir, Raltegravir (no RAM's) PI - S ATV, drv, fosamprenavir, Indinavir, Lopinavir, Nelfinavir, Ritonavir, Saquinavir, Tipranavir -ART starts 03/15/18 -therapy 09/06/18-current prezcobix, descovy, etravirine; last note 07/2019 menions no missed dose 04/06/18-09/05/18 lamivudine/dolutebravir  -- virologic failure 03/15/18-04/06/18 biktarvy  -- ddi -hiv labs 07/26/2019 cd4 589 (14%), VL 3140  01/09/2019 cd4 140 and viral load 4.5 million (off meds for some time prior to that) 09/2018 cd4 370 (23%), VL 30k 03/2018 cd4 459, VL 1.06 million  Social: -born/raised in [place]; no prior travel out of state -have a dog "Paul Serrano" of 4 months old as of 08/2020 -hobbies singing; church choir; no outdoor activities; no hunting/fishing -lives alone in [place] -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 -no smoking, drinking, substance use including marijanna -2 sisters in [place] -no hx homelessness/incarceration -not in a relationship at this time -last time sexually active June 2021  Labs reviewed from 07/2020  Quant gold indeterminate Patient's test was positive for specific/nonsepcific titer. No prior tx or known exposure. Doesn't recall any rash on penis/body   covid vaccines: 12/2019 finished moderna vaccine no booster   Review of Systems: ROS Other ros negative  Past Medical History:  Diagnosis Date  . HIV (human immunodeficiency virus infection) ([place])     Social History   Tobacco Use  . Smoking status: Never Smoker  .  Smokeless tobacco: Never Used  Substance Use Topics  . Alcohol use: Never  . Drug use: Never    No family history on file. No Known Allergies  OBJECTIVE: Vitals:   01/13/21 0928  Weight: 143 lb 9.6 oz (65.1 kg)   Body mass index is 19.48 kg/m.   Physical Exam General: alert/oriented; no distress Psych: doesn't seem to respond to internal stimulit; conversant; interactive; cooperative HEENT: normocephalic; per; ocnj clear; oropharynx thrush Skin: facial seborrheic dermatitis improved from 08/2020 visit cv rrr no mrg Lungs clear abd s/nt Ext no edema Neuro cn2-12 intact; strength/reflex intact; no tremor Back nontender Lymph no cervical/ingiunal/axillary lymphadenopathy  Lab: 08/18/2020 Cbc 5/8/263; cr 0.65; lft 26/8; tbili 0.3    Microbiology: No results found for this or any previous visit (from the past 240 hour(s)).  Serology: RPR 1:1 fta positive Hep A ab reactive Hep B sAb, cAb, sAb nonreactive Hep C cAb nonreactive Hiv 5.8 million units cd4 55 (5%)  Imaging: 11/2 cxr clear  Assessment/plan: 25 yo male hx paranoid schizophrenia, Paul, here for f/u hiv care  Patient Active Problem List   Diagnosis Date Noted  . Encephalopathy 01/13/2021  . Sickle cell trait ([place]) 09/29/2020  .  HIV disease ([place]) 08/04/2020  . Giardia 08/04/2020  . Diarrhea, infectious, adult 08/04/2020  . Iron deficiency anemia 08/04/2020  . Healthcare maintenance 08/04/2020  . Mycobacterium avium infection ([place])   . Schizophrenia ([place]) 07/14/2020  . Paul ([place]) 04/14/2018  . Anemia 04/14/2018  . 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 Pulmonary nodules 04/04/2018  . Pulmonary infiltrate 03/14/2018  . 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 Genotype on biktarvy showed resistance to all tivicay, biktarvy, multiple nrti, but no PI resistance known. Subsequently switched to dovato (perhaps no genotype data yet by then), and currently prezcobix/etravirine/descovy. Viral load in the millions 07/2018 and cd4 55. Last genotype 07/2019 sensitive to several nrti, nnrti, PI, and INI.  -continue treatment experienced regimen of prezista/r, doravirine, and bid tivicay -will see if we can adjust his medication regimen based on repeat hiv testing -rukobia and other fusion/entry inhibitor potentially could be used in future if needed; he doesn't have chronic hep b -discussed u=u -discussed compliant but might need to investigate mental health -f/u 4 weeks   #cough -low cd4 will keep broad ddx; not septic; remains well otherwise. cxr clear -fungitell/ldh today as not done previously -given clear cxr continue bactrim ss daily prophy -I will get chest ct to see if there are other concerning parenchynmal changes  #syphilis -latent infection presumption per history (no rash). 3 shots benzathine pcn weekly done by 08/2020 -repeat rpr titer  #hx schizo -not on meds. Patient doing well socially (college/job) and doesn't appear to be affected.  -continue to monitor  #hcm Will review vaccine once better level of cd4 prior to starting hep b vaccine  I spent more than 20 minute reviewing data/chart, and coordinating care and >50% direct face to face time providing  counseling/discussing diagnostics/treatment plan with patient   Follow-up: Return in about 4 weeks (around 02/10/2021).   Addendum: Post visit labs reviewed Lab Results  Component Value Date   HIV1RNAQUANT 7,470,000 (H) 01/13/2021    I reviewed genotype (last done 08/2020 while on ART; no integrase inhibitor mutation; virus is sensitive to doravirine) I also reviewed previous genotype (indicated in HPI portion)  His viral load is not consistent with someone on bid prezista/boosted, doravirine, and bid tivicay. This is despite the fact that he is telling me he has been taking his medication  I will have our staff reach out to his pharmacy to see about the refills he had done for his ART, and will see patient in 1 month and advised him to bring all his pill box for counts   Jabier Mutton, [facility] -- -- pager   [phone] cell 01/13/2021, 9:31 AM

## 2021-01-13 NOTE — Patient Instructions (Addendum)
You are due for blood and urine testing today  Continue current medication for now  Once your cd4 improves, will review immuninization   Please call us if anything worsens with your cough  I will order a chest ct for you as you have had chronic cough and I am trying to figure out what it is  Follow up in 1 month

## 2021-01-14 LAB — URINALYSIS, ROUTINE W REFLEX MICROSCOPIC
Bacteria, UA: NONE SEEN /HPF
Bilirubin Urine: NEGATIVE
Glucose, UA: NEGATIVE
Hgb urine dipstick: NEGATIVE
Hyaline Cast: NONE SEEN /LPF
Ketones, ur: NEGATIVE
Leukocytes,Ua: NEGATIVE
Nitrite: NEGATIVE
RBC / HPF: NONE SEEN /HPF (ref 0–2)
Specific Gravity, Urine: 1.014 (ref 1.001–1.03)
Squamous Epithelial / HPF: NONE SEEN /HPF (ref <=5)
WBC, UA: NONE SEEN /HPF (ref 0–5)
pH: 7 (ref 5.0–8.0)

## 2021-01-14 LAB — URINE CYTOLOGY ANCILLARY ONLY
Chlamydia: NEGATIVE
Comment: NEGATIVE
Comment: NORMAL
Neisseria Gonorrhea: NEGATIVE

## 2021-01-14 LAB — T-HELPER CELL (CD4) - (RCID CLINIC ONLY)
CD4 % Helper T Cell: 5 % — ABNORMAL LOW (ref 33–65)
CD4 T Cell Abs: 53 /uL — ABNORMAL LOW (ref 400–1790)

## 2021-01-18 ENCOUNTER — Telehealth: Payer: Self-pay

## 2021-01-18 NOTE — Addendum Note (Signed)
Addended byRutha Bouchard T on: 01/18/2021 02:00 PM   Modules accepted: Orders

## 2021-01-18 NOTE — Telephone Encounter (Signed)
Contacted patient to discuss medication regimen as his VL is in the millions despite patient reporting consistent adherence. Unable to reach patient. Left voicemail requesting a call back and to schedule 1 month lab work.   Contacted pharmacy. Last fill for all 4 meds on regimen was 2/28. Prior to that last fill was 09/01/2020.   Antonis Lor Loyola Mast, RN

## 2021-01-19 NOTE — Telephone Encounter (Signed)
Called patient to review labs and med regimen. Phone call went straight to voicemail.   Quenna Doepke Loyola Mast, RN

## 2021-01-21 NOTE — Telephone Encounter (Signed)
3rd attempt to reach patient. Sent MyChart message to contact our office.  Paul Serrano

## 2021-01-22 ENCOUNTER — Other Ambulatory Visit: Payer: Self-pay | Admitting: Internal Medicine

## 2021-01-22 DIAGNOSIS — B20 Human immunodeficiency virus [HIV] disease: Secondary | ICD-10-CM

## 2021-01-22 LAB — CBC WITH DIFFERENTIAL/PLATELET
Absolute Monocytes: 437 cells/uL (ref 200–950)
Basophils Absolute: 9 cells/uL (ref 0–200)
Basophils Relative: 0.2 %
Eosinophils Absolute: 0 cells/uL — ABNORMAL LOW (ref 15–500)
Eosinophils Relative: 0 %
HCT: 23.1 % — ABNORMAL LOW (ref 38.5–50.0)
Hemoglobin: 7.3 g/dL — ABNORMAL LOW (ref 13.2–17.1)
Lymphs Abs: 1205 cells/uL (ref 850–3900)
MCH: 25.2 pg — ABNORMAL LOW (ref 27.0–33.0)
MCHC: 31.6 g/dL — ABNORMAL LOW (ref 32.0–36.0)
MCV: 79.7 fL — ABNORMAL LOW (ref 80.0–100.0)
MPV: 10.3 fL (ref 7.5–12.5)
Monocytes Relative: 9.5 %
Neutro Abs: 2949 cells/uL (ref 1500–7800)
Neutrophils Relative %: 64.1 %
Platelets: 314 10*3/uL (ref 140–400)
RBC: 2.9 10*6/uL — ABNORMAL LOW (ref 4.20–5.80)
RDW: 14.6 % (ref 11.0–15.0)
Total Lymphocyte: 26.2 %
WBC: 4.6 10*3/uL (ref 3.8–10.8)

## 2021-01-22 LAB — HIV-1 GENOTYPE: HIV-1 Genotype: DETECTED — AB

## 2021-01-22 LAB — HIV-1 RNA ULTRAQUANT REFLEX TO GENTYP+
HIV 1 RNA Quant: 7470000 copies/mL — ABNORMAL HIGH
HIV-1 RNA Quant, Log: 6.87 Log copies/mL — ABNORMAL HIGH

## 2021-01-22 LAB — QUANTIFERON-TB GOLD PLUS
Mitogen-NIL: 0.41 IU/mL
NIL: 0.03 IU/mL
QuantiFERON-TB Gold Plus: UNDETERMINED — AB
TB1-NIL: 0.17 IU/mL
TB2-NIL: 0 IU/mL

## 2021-01-22 LAB — FUNGITELL (1-3)-B-D-GLUCAN
(1-3)-B-D-Glucan: 32 pg/mL (ref <60)
Interpretation: NEGATIVE

## 2021-01-22 LAB — T.PALLIDUM AB, TOTAL: T pallidum Antibodies (TP-PA): NONREACTIVE

## 2021-01-22 NOTE — Telephone Encounter (Signed)
Patient information placed in folder for [facility].   Adhvik Canady Loyola Mast, RN

## 2021-01-22 NOTE — Telephone Encounter (Signed)
Thanks Dorie!

## 2021-01-23 ENCOUNTER — Other Ambulatory Visit: Payer: Self-pay | Admitting: Internal Medicine

## 2021-01-23 DIAGNOSIS — B20 Human immunodeficiency virus [HIV] disease: Secondary | ICD-10-CM

## 2021-01-25 ENCOUNTER — Other Ambulatory Visit: Payer: Self-pay

## 2021-01-25 DIAGNOSIS — B20 Human immunodeficiency virus [HIV] disease: Secondary | ICD-10-CM

## 2021-01-25 MED ORDER — DORAVIRINE 100 MG PO TABS: 100.0000 mg | ORAL_TABLET | Freq: Every day | ORAL | 0 refills | Status: AC

## 2021-01-25 MED ORDER — DARUNAVIR ETHANOLATE 600 MG PO TABS: 600.0000 mg | ORAL_TABLET | Freq: Two times a day (BID) | ORAL | 0 refills | Status: AC

## 2021-01-25 MED ORDER — DOLUTEGRAVIR SODIUM 50 MG PO TABS: 50.0000 mg | ORAL_TABLET | Freq: Two times a day (BID) | ORAL | 0 refills | Status: AC

## 2021-01-25 MED ORDER — RITONAVIR 100 MG PO TABS: 100.0000 mg | ORAL_TABLET | Freq: Two times a day (BID) | ORAL | 0 refills | Status: AC

## 2021-02-01 ENCOUNTER — Telehealth: Payer: Self-pay

## 2021-02-01 NOTE — Telephone Encounter (Signed)
Patient will be dropping off temporary leave paperwork for Dr Renold Don to fill out. Informed him that if Dr. Renold Don is able to fill it out it'll be roughly a week to 14 days before it's completed. Patient will drop it off next week once he's in town.   Liisa Picone Loyola Mast, RN

## 2021-02-02 ENCOUNTER — Ambulatory Visit: Payer: 59 | Admitting: Internal Medicine

## 2021-02-10 ENCOUNTER — Other Ambulatory Visit: Payer: Self-pay

## 2021-02-10 ENCOUNTER — Telehealth: Payer: Self-pay

## 2021-02-10 ENCOUNTER — Ambulatory Visit (HOSPITAL_COMMUNITY)
Admission: RE | Admit: 2021-02-10 | Discharge: 2021-02-10 | Disposition: A | Discharge status: Home / Self Care | Payer: 59 | Source: Ambulatory Visit | Attending: Internal Medicine | Admitting: Internal Medicine

## 2021-02-10 ENCOUNTER — Encounter (HOSPITAL_COMMUNITY): Payer: Self-pay

## 2021-02-10 DIAGNOSIS — R059 Cough, unspecified: Secondary | ICD-10-CM | POA: Diagnosis present

## 2021-02-10 MED ORDER — IOHEXOL 300 MG/ML  SOLN
75.0000 mL | Freq: Once | INTRAMUSCULAR | Status: AC | PRN
  Administered 2021-02-10: 75 mL via INTRAVENOUS

## 2021-02-10 NOTE — Telephone Encounter (Signed)
Patient was a walk in to drop off leave of absence forms. Patient is reporting abdominal pain and "gas" after eating. Reports that it started shortly after restarting medication. 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 Requesting forms be filled out asap;  Let patient know that Dr. Renold Don is not in the office but will try to fill out if he can. Did instruct patient to locate a PCP as they might be able to help sooner with concerns. Patient verbalized understanding. Patient's next appointment isn't until May 11th. Notified him that his refills were sent 01/25/21 to pharmacy.  Aneudy Champlain Loyola Mast, RN

## 2021-02-17 ENCOUNTER — Telehealth: Payer: Self-pay

## 2021-02-17 NOTE — Telephone Encounter (Signed)
Patient called in regards to his CTchest scan results he seen on mychart. Patient is requesting call back in regards to his results.  Please advise.  [name] T Pricilla Loveless

## 2021-02-17 NOTE — Telephone Encounter (Signed)
Patient has been advised his results will be discussed with him the day of his appointment. Patient asked if he needed to go ahead and start an antibiotic.  Paul Serrano T Paul Serrano

## 2021-02-17 NOTE — Telephone Encounter (Signed)
Patient is scheduled for 03/10/21 with you. I can let him know you will discuss his results at his appointment. Sharanda Shinault T Pricilla Loveless

## 2021-02-18 NOTE — Telephone Encounter (Signed)
I attempted to contact the patient to follow up with him to ask if he is currently still having symptoms. Patient did not answer and no secured voicemail setup

## 2021-02-22 NOTE — Telephone Encounter (Signed)
Will send patient a MyChart message stating Dr. Renold Don will go over results with patient at upcoming follow up visit.

## 2021-02-23 NOTE — Telephone Encounter (Signed)
Patient calling for status update on leave of absence forms. When asked if patient has tried to reach out to establish care with PCP patient states he still has not found one. Patient states will continue to try this as Dr. Renold Don is not back in the office until the second week of May. Patient again verbalized understanding.  Valarie Cones

## 2021-02-23 NOTE — Telephone Encounter (Signed)
Patient's grand mother called stating patient is having SOB, loss of appetite and diarrhea. Patient has been instructed to go the the ER.  Terryn Rosenkranz T Pricilla Loveless

## 2021-03-10 ENCOUNTER — Ambulatory Visit: Payer: 59 | Admitting: Internal Medicine

## 2021-03-12 ENCOUNTER — Other Ambulatory Visit: Payer: Self-pay | Admitting: Internal Medicine

## 2021-03-12 DIAGNOSIS — B20 Human immunodeficiency virus [HIV] disease: Secondary | ICD-10-CM

## 2021-03-18 ENCOUNTER — Telehealth: Payer: Self-pay

## 2021-03-18 NOTE — Telephone Encounter (Signed)
I dont think he ever saw me to discuss what problem he has in regard to the paperwork  I am not sure it is legal to fill it out.   If he insists, please have him send a my chart story with dates and symptoms he had. I can try to do it

## 2021-03-18 NOTE — Telephone Encounter (Signed)
Patient is calling in regards to his medical leave forms he dropped off in May. Patient is asking has the paper work been filled out yet. Do you still have these forms Dr. Renold Don. Patient also stated he will be transferring care to ID in [facility] (Dr. Milana Obey) Kennett Symes T Pricilla Loveless

## 2021-03-19 NOTE — Telephone Encounter (Signed)
I called patient to to get him scheduled for a follow up with Dr. Renold Don and the paper work he is needing filled out. Patient did not answer and no voicemail setup.  Paul Serrano T Pricilla Loveless

## 2021-03-19 NOTE — Telephone Encounter (Signed)
Patient returned phone call and will be coming in on 04/07/21 for a follow up and to discuss he paper work that needs filling out. I have asked that patient bring another copy of the paper work he is needing filled out in.

## 2021-04-07 ENCOUNTER — Ambulatory Visit: Payer: 59 | Admitting: Internal Medicine

## 2021-08-23 ENCOUNTER — Telehealth: Payer: Self-pay

## 2021-08-23 NOTE — Telephone Encounter (Signed)
Appears that patient has transferred HIV care to [facility]. Called patient to confirm, no answer. Left HIPAA compliant voicemail requesting callback.   Sandie Ano, RN

## 2023-01-10 IMAGING — CT CT CHEST W/ CM
2 of 4 series · 15 of 36 positions shown, 18 images · IV contrast (OMNIPAQUE)
Comparison: Chest x-ray 09/01/2020

CLINICAL DATA: HIV positive/aids, chronic cough.

EXAM:
CT CHEST WITH CONTRAST
TECHNIQUE: Multidetector CT imaging of the chest was performed during
intravenous contrast administration.
CONTRAST:  75mL OMNIPAQUE IOHEXOL 300 MG/ML  SOLN

[Series 2: axial st · axial · 0.74mm/px · z∈[-147,+165]mm · 12 of 184 slices shown, 15 images]
[im 14/184  mediastinal]
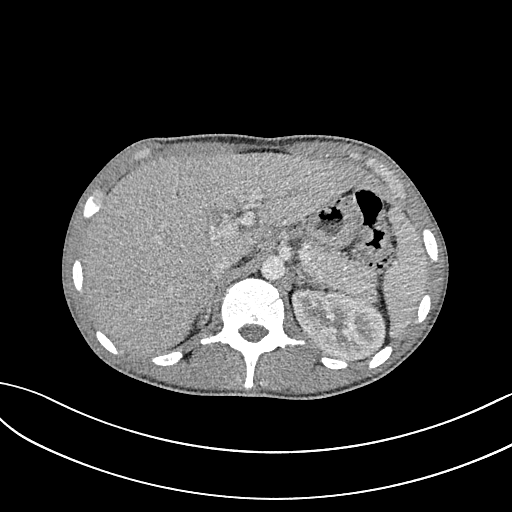
[im 14/184  lung]
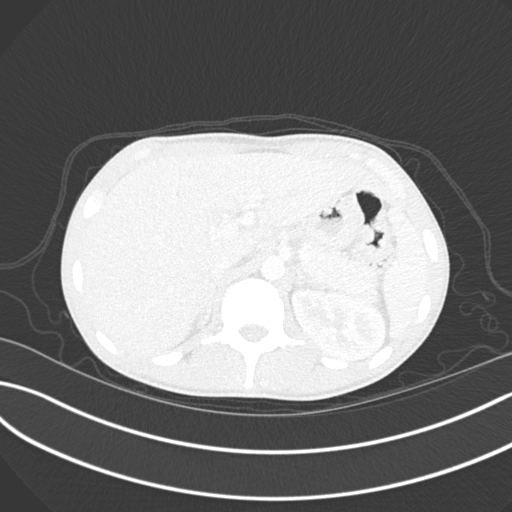
[im 27/184  lung]
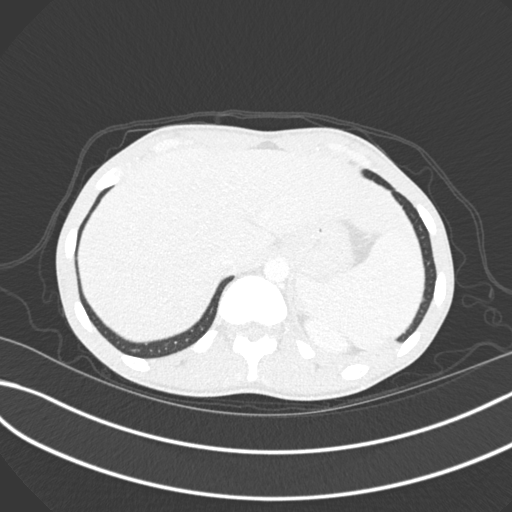
[im 40/184  lung]
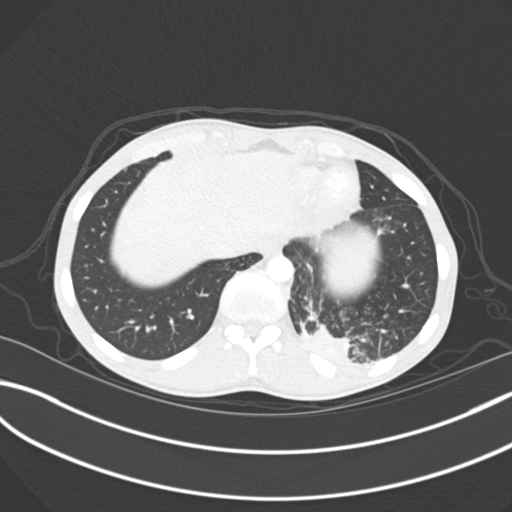
[im 53/184  lung]
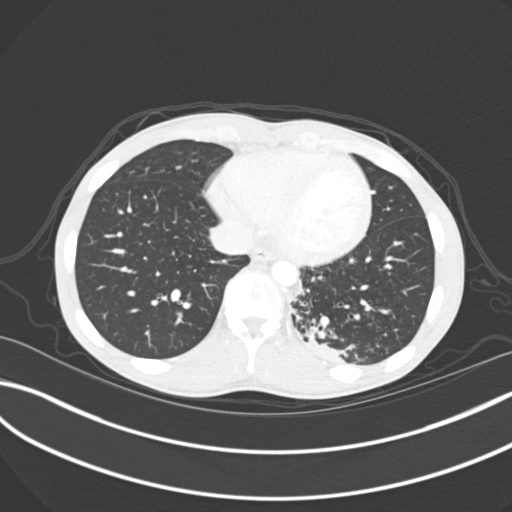
[im 66/184  mediastinal]
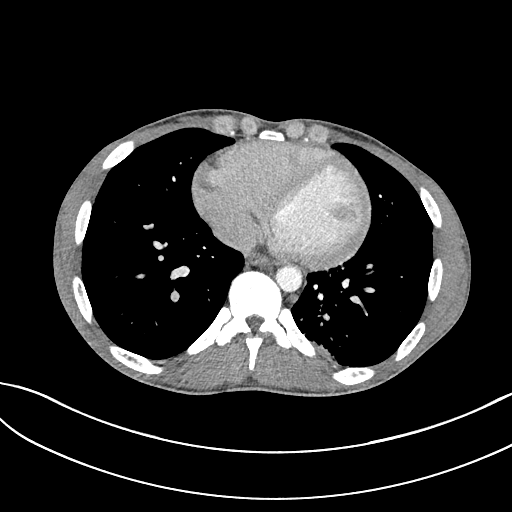
[im 66/184  lung]
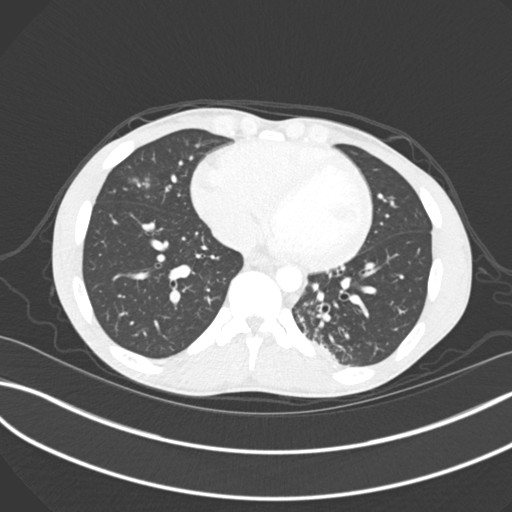
[im 79/184  lung]
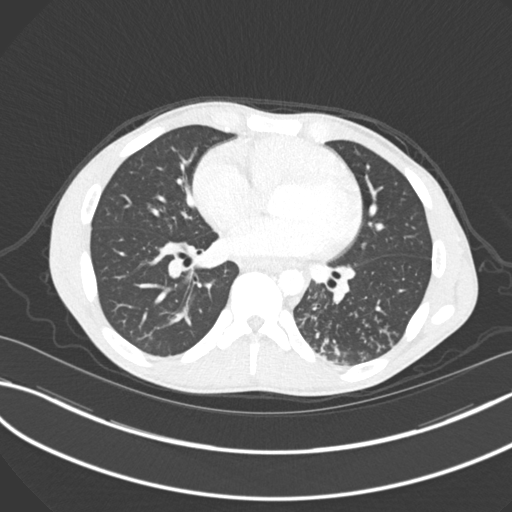
[im 105/184  lung]
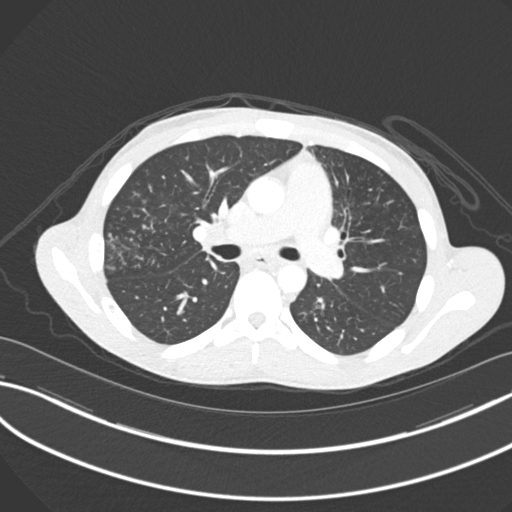
[im 118/184  lung]
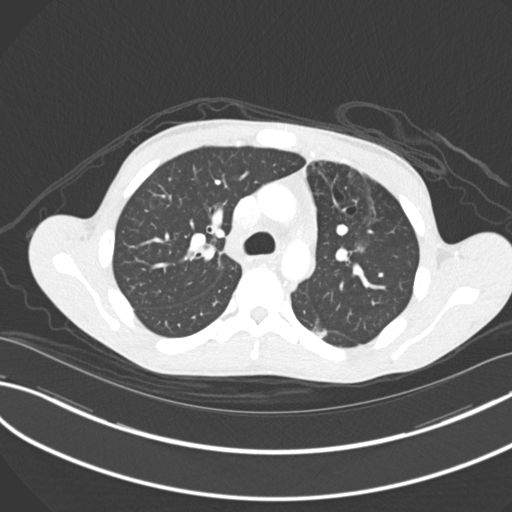
[im 131/184  mediastinal]
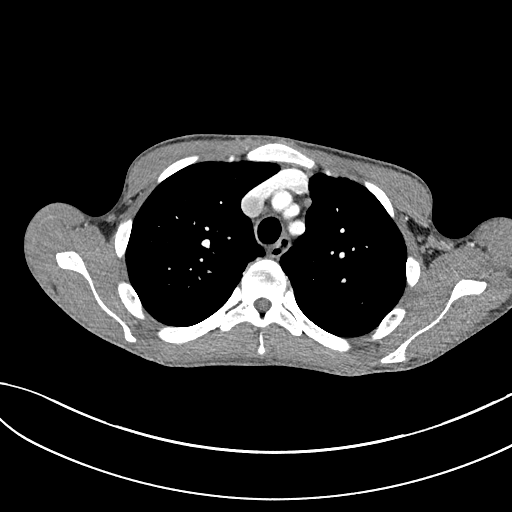
[im 131/184  lung]
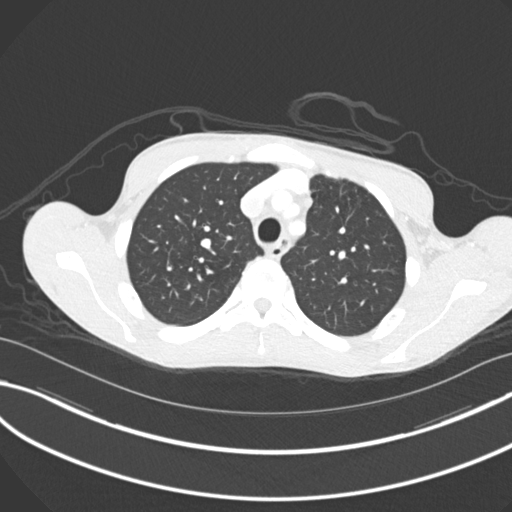
[im 144/184  lung]
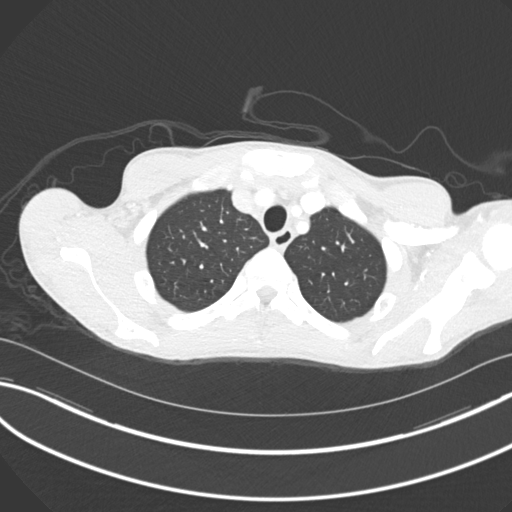
[im 157/184  lung]
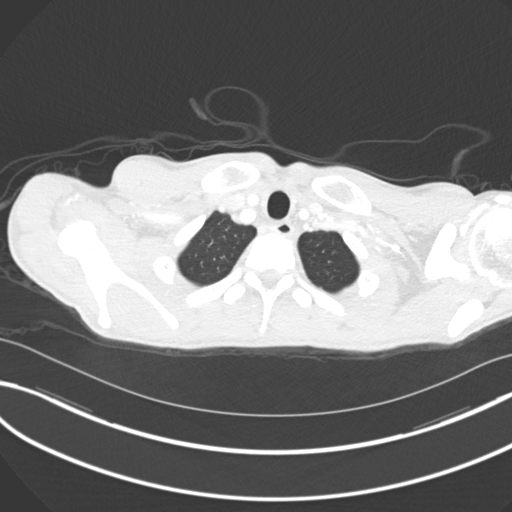
[im 170/184  lung]
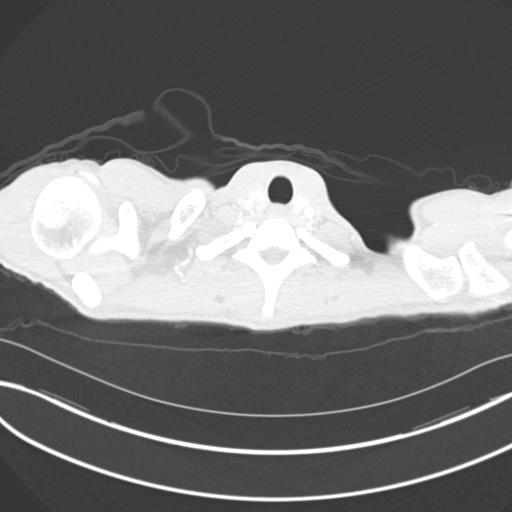

[Series 5: coronal · coronal · 0.74mm/px · 3 of 114 slices shown]
[im 23/114  lung]
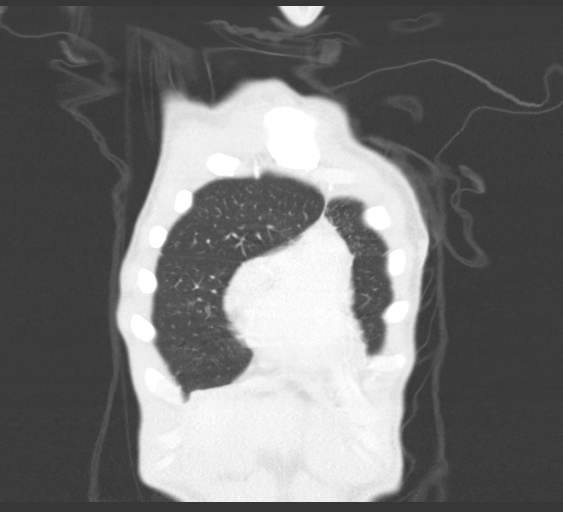
[im 46/114  lung]
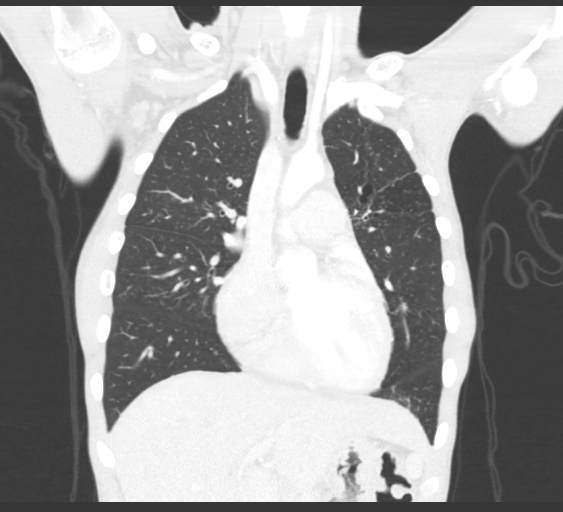
[im 68/114  lung]
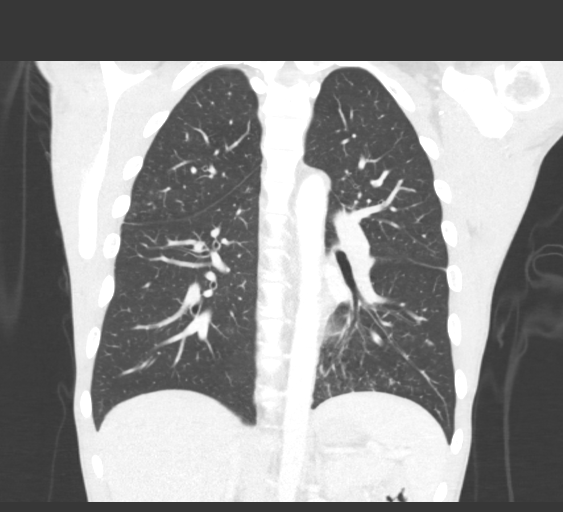

[15 of 36 positions shown; findings below may reference images not displayed]

FINDINGS: Cardiovascular: Heart is normal size. Thoracic aorta and great
vessels are normal. Pulmonary arterial system and remaining vascular
structures are unremarkable.

Mediastinum/Nodes: No mediastinal or hilar adenopathy. Remaining
mediastinal structures are normal.

Lungs/Pleura: Lungs are adequately inflated and demonstrate patchy
multifocal nodular airspace process with predominant involvement of
the left lower lobe and also involving the right upper lobe and
right middle lobe as well as minimally over the left upper lobe.
Relative sparing of the right lower lobe and lingula. No effusion.
Possible associated atelectatic change of the left face. Airways are
unremarkable.

Upper Abdomen: No acute findings.

Musculoskeletal: No focal abnormality.
IMPRESSION: Patchy multifocal nodular airspace process with predominant
involvement of the left lower lobe. Findings likely due to
multifocal pneumonia.
